# Patient Record
Sex: Female | Born: 1980 | Race: Black or African American | Hispanic: No | Marital: Single | Smoking: Former smoker
Health system: Southern US, Community
[De-identification: ages and names within clinical notes are randomized; demographics above are authoritative.]

## PROBLEM LIST (undated history)

## (undated) DIAGNOSIS — I10 Essential (primary) hypertension: Secondary | ICD-10-CM

## (undated) DIAGNOSIS — S3992XA Unspecified injury of lower back, initial encounter: Secondary | ICD-10-CM

## (undated) DIAGNOSIS — J45909 Unspecified asthma, uncomplicated: Secondary | ICD-10-CM

## (undated) HISTORY — DX: Unspecified asthma, uncomplicated: J45.909

## (undated) HISTORY — DX: Essential (primary) hypertension: I10

## (undated) HISTORY — PX: NO PAST SURGERIES: SHX2092

---

## 2004-06-23 ENCOUNTER — Ambulatory Visit (HOSPITAL_COMMUNITY): Admission: RE | Admit: 2004-06-23 | Discharge: 2004-06-23 | Payer: Self-pay | Admitting: *Deleted

## 2004-09-20 ENCOUNTER — Ambulatory Visit: Payer: Self-pay | Admitting: Family Medicine

## 2004-09-20 ENCOUNTER — Inpatient Hospital Stay (HOSPITAL_COMMUNITY): Admission: AD | Admit: 2004-09-20 | Discharge: 2004-09-20 | Payer: Self-pay | Admitting: *Deleted

## 2004-09-27 ENCOUNTER — Ambulatory Visit: Payer: Self-pay | Admitting: *Deleted

## 2004-10-05 ENCOUNTER — Ambulatory Visit: Payer: Self-pay | Admitting: Family Medicine

## 2004-10-12 ENCOUNTER — Ambulatory Visit: Payer: Self-pay | Admitting: Family Medicine

## 2004-10-19 ENCOUNTER — Ambulatory Visit: Payer: Self-pay | Admitting: Family Medicine

## 2004-10-23 ENCOUNTER — Ambulatory Visit: Payer: Self-pay | Admitting: *Deleted

## 2004-10-26 ENCOUNTER — Ambulatory Visit: Payer: Self-pay | Admitting: Family Medicine

## 2004-10-27 ENCOUNTER — Inpatient Hospital Stay (HOSPITAL_COMMUNITY): Admission: AD | Admit: 2004-10-27 | Discharge: 2004-10-27 | Payer: Self-pay | Admitting: *Deleted

## 2004-10-27 ENCOUNTER — Inpatient Hospital Stay (HOSPITAL_COMMUNITY): Admission: AD | Admit: 2004-10-27 | Discharge: 2004-10-30 | Payer: Self-pay | Admitting: *Deleted

## 2004-10-28 ENCOUNTER — Ambulatory Visit: Payer: Self-pay | Admitting: *Deleted

## 2004-10-31 ENCOUNTER — Inpatient Hospital Stay (HOSPITAL_COMMUNITY): Admission: AD | Admit: 2004-10-31 | Discharge: 2004-10-31 | Payer: Self-pay | Admitting: *Deleted

## 2006-10-31 ENCOUNTER — Emergency Department (HOSPITAL_COMMUNITY): Admission: EM | Admit: 2006-10-31 | Discharge: 2006-10-31 | Payer: Self-pay | Admitting: Emergency Medicine

## 2007-12-31 ENCOUNTER — Emergency Department (HOSPITAL_COMMUNITY): Admission: EM | Admit: 2007-12-31 | Discharge: 2007-12-31 | Payer: Self-pay | Admitting: Emergency Medicine

## 2009-04-25 ENCOUNTER — Ambulatory Visit (HOSPITAL_COMMUNITY): Admission: RE | Admit: 2009-04-25 | Discharge: 2009-04-25 | Payer: Self-pay | Admitting: Obstetrics

## 2009-04-28 ENCOUNTER — Emergency Department (HOSPITAL_COMMUNITY): Admission: EM | Admit: 2009-04-28 | Discharge: 2009-04-28 | Payer: Self-pay | Admitting: Emergency Medicine

## 2009-06-24 ENCOUNTER — Inpatient Hospital Stay (HOSPITAL_COMMUNITY): Admission: AD | Admit: 2009-06-24 | Discharge: 2009-06-24 | Payer: Self-pay | Admitting: Obstetrics

## 2009-06-24 ENCOUNTER — Ambulatory Visit: Payer: Self-pay | Admitting: Advanced Practice Midwife

## 2009-09-05 ENCOUNTER — Inpatient Hospital Stay (HOSPITAL_COMMUNITY): Admission: RE | Admit: 2009-09-05 | Discharge: 2009-09-07 | Payer: Self-pay | Admitting: Obstetrics

## 2009-09-13 ENCOUNTER — Inpatient Hospital Stay (HOSPITAL_COMMUNITY): Admission: AD | Admit: 2009-09-13 | Discharge: 2009-09-13 | Payer: Self-pay | Admitting: Obstetrics

## 2009-12-30 ENCOUNTER — Encounter: Admission: RE | Admit: 2009-12-30 | Discharge: 2009-12-30 | Payer: Self-pay | Admitting: Neurology

## 2010-10-11 LAB — CBC
HCT: 26.5 % — ABNORMAL LOW (ref 36.0–46.0)
HCT: 30.6 % — ABNORMAL LOW (ref 36.0–46.0)
HCT: 32.3 % — ABNORMAL LOW (ref 36.0–46.0)
HCT: 32.7 % — ABNORMAL LOW (ref 36.0–46.0)
Hemoglobin: 10.2 g/dL — ABNORMAL LOW (ref 12.0–15.0)
Hemoglobin: 10.7 g/dL — ABNORMAL LOW (ref 12.0–15.0)
Hemoglobin: 10.9 g/dL — ABNORMAL LOW (ref 12.0–15.0)
Hemoglobin: 8.8 g/dL — ABNORMAL LOW (ref 12.0–15.0)
MCHC: 33 g/dL (ref 30.0–36.0)
MCHC: 33.2 g/dL (ref 30.0–36.0)
MCHC: 33.3 g/dL (ref 30.0–36.0)
MCHC: 33.3 g/dL (ref 30.0–36.0)
MCV: 91.3 fL (ref 78.0–100.0)
MCV: 92.2 fL (ref 78.0–100.0)
MCV: 92.6 fL (ref 78.0–100.0)
MCV: 92.8 fL (ref 78.0–100.0)
Platelets: 202 10*3/uL (ref 150–400)
Platelets: 248 10*3/uL (ref 150–400)
Platelets: 256 10*3/uL (ref 150–400)
Platelets: 439 10*3/uL — ABNORMAL HIGH (ref 150–400)
RBC: 2.86 MIL/uL — ABNORMAL LOW (ref 3.87–5.11)
RBC: 3.36 MIL/uL — ABNORMAL LOW (ref 3.87–5.11)
RBC: 3.49 MIL/uL — ABNORMAL LOW (ref 3.87–5.11)
RBC: 3.54 MIL/uL — ABNORMAL LOW (ref 3.87–5.11)
RDW: 14.4 % (ref 11.5–15.5)
RDW: 14.6 % (ref 11.5–15.5)
RDW: 15.2 % (ref 11.5–15.5)
RDW: 15.3 % (ref 11.5–15.5)
WBC: 10.7 10*3/uL — ABNORMAL HIGH (ref 4.0–10.5)
WBC: 11.3 10*3/uL — ABNORMAL HIGH (ref 4.0–10.5)
WBC: 5.9 10*3/uL (ref 4.0–10.5)
WBC: 5.9 10*3/uL (ref 4.0–10.5)

## 2010-10-11 LAB — URINE MICROSCOPIC-ADD ON

## 2010-10-11 LAB — COMPREHENSIVE METABOLIC PANEL
ALT: 30 U/L (ref 0–35)
AST: 35 U/L (ref 0–37)
Albumin: 2.5 g/dL — ABNORMAL LOW (ref 3.5–5.2)
Alkaline Phosphatase: 130 U/L — ABNORMAL HIGH (ref 39–117)
BUN: 6 mg/dL (ref 6–23)
CO2: 24 mEq/L (ref 19–32)
Calcium: 8.6 mg/dL (ref 8.4–10.5)
Chloride: 107 mEq/L (ref 96–112)
Creatinine, Ser: 0.55 mg/dL (ref 0.4–1.2)
GFR calc Af Amer: >60 mL/min (ref >60)
GFR calc non Af Amer: >60 mL/min (ref >60)
Glucose, Bld: 111 mg/dL — ABNORMAL HIGH (ref 70–99)
Potassium: 3.2 mEq/L — ABNORMAL LOW (ref 3.5–5.1)
Sodium: 136 mEq/L (ref 135–145)
Total Bilirubin: 0.4 mg/dL (ref 0.3–1.2)
Total Protein: 5.4 g/dL — ABNORMAL LOW (ref 6.0–8.3)

## 2010-10-11 LAB — CCBB MATERNAL DONOR DRAW

## 2010-10-11 LAB — ALT: ALT: 21 U/L (ref 0–35)

## 2010-10-11 LAB — URINALYSIS, ROUTINE W REFLEX MICROSCOPIC
Bilirubin Urine: NEGATIVE
Glucose, UA: NEGATIVE mg/dL
Ketones, ur: NEGATIVE mg/dL
Nitrite: NEGATIVE
Protein, ur: 30 mg/dL — AB
Specific Gravity, Urine: 1.025 (ref 1.005–1.030)
Urobilinogen, UA: 0.2 mg/dL (ref 0.0–1.0)
pH: 6.5 (ref 5.0–8.0)

## 2010-10-11 LAB — LACTATE DEHYDROGENASE
LDH: 158 U/L (ref 94–250)
LDH: 208 U/L (ref 94–250)

## 2010-10-11 LAB — RPR: RPR Ser Ql: NONREACTIVE

## 2010-10-11 LAB — URIC ACID
Uric Acid, Serum: 3.9 mg/dL (ref 2.4–7.0)
Uric Acid, Serum: 5.5 mg/dL (ref 2.4–7.0)

## 2010-10-11 LAB — AST: AST: 20 U/L (ref 0–37)

## 2010-10-24 LAB — URINALYSIS, ROUTINE W REFLEX MICROSCOPIC
Bilirubin Urine: NEGATIVE
Glucose, UA: NEGATIVE mg/dL
Hgb urine dipstick: NEGATIVE
Ketones, ur: >80 mg/dL — AB
Nitrite: NEGATIVE
Protein, ur: NEGATIVE mg/dL
Specific Gravity, Urine: >1.03 — ABNORMAL HIGH (ref 1.005–1.030)
Urobilinogen, UA: 0.2 mg/dL (ref 0.0–1.0)
pH: 6 (ref 5.0–8.0)

## 2011-04-19 LAB — RAPID STREP SCREEN (MED CTR MEBANE ONLY): Streptococcus, Group A Screen (Direct): POSITIVE — AB

## 2013-11-04 ENCOUNTER — Encounter (HOSPITAL_COMMUNITY): Payer: Self-pay | Admitting: Emergency Medicine

## 2013-11-04 ENCOUNTER — Emergency Department (HOSPITAL_COMMUNITY)
Admission: EM | Admit: 2013-11-04 | Discharge: 2013-11-04 | Disposition: A | Discharge status: Home / Self Care | Payer: Self-pay | Attending: Emergency Medicine | Admitting: Emergency Medicine

## 2013-11-04 DIAGNOSIS — F17200 Nicotine dependence, unspecified, uncomplicated: Secondary | ICD-10-CM | POA: Insufficient documentation

## 2013-11-04 DIAGNOSIS — M62838 Other muscle spasm: Secondary | ICD-10-CM | POA: Insufficient documentation

## 2013-11-04 DIAGNOSIS — Z87828 Personal history of other (healed) physical injury and trauma: Secondary | ICD-10-CM | POA: Insufficient documentation

## 2013-11-04 DIAGNOSIS — M25559 Pain in unspecified hip: Secondary | ICD-10-CM | POA: Insufficient documentation

## 2013-11-04 DIAGNOSIS — M549 Dorsalgia, unspecified: Secondary | ICD-10-CM | POA: Insufficient documentation

## 2013-11-04 DIAGNOSIS — M62830 Muscle spasm of back: Secondary | ICD-10-CM

## 2013-11-04 HISTORY — DX: Unspecified injury of lower back, initial encounter: S39.92XA

## 2013-11-04 MED ORDER — CYCLOBENZAPRINE HCL 10 MG PO TABS: 10.0000 mg | ORAL_TABLET | Freq: Three times a day (TID) | ORAL | Status: DC | PRN

## 2013-11-04 MED ORDER — NAPROXEN 500 MG PO TABS: 500.0000 mg | ORAL_TABLET | Freq: Two times a day (BID) | ORAL | Status: DC

## 2013-11-04 MED ORDER — HYDROCODONE-ACETAMINOPHEN 5-325 MG PO TABS: 1.0000 | ORAL_TABLET | Freq: Four times a day (QID) | ORAL | Status: DC | PRN

## 2013-11-04 NOTE — Discharge Instructions (Signed)
SEEK IMMEDIATE MEDICAL ATTENTION IF: °New numbness, tingling, weakness, or problem with the use of your arms or legs.  °Severe back pain not relieved with medications.  °Change in bowel or bladder control.  °Increasing pain in any areas of the body (such as chest or abdominal pain).  °Shortness of breath, dizziness or fainting.  °Nausea (feeling sick to your stomach), vomiting, fever, or sweats. ° ° ° °Back Pain, Adult °Low back pain is very common. About 1 in 5 people have back pain. The cause of low back pain is rarely dangerous. The pain often gets better over time. About half of people with a sudden onset of back pain feel better in just 2 weeks. About 8 in 10 people feel better by 6 weeks.  °CAUSES °Some common causes of back pain include: °· Strain of the muscles or ligaments supporting the spine. °· Wear and tear (degeneration) of the spinal discs. °· Arthritis. °· Direct injury to the back. °DIAGNOSIS °Most of the time, the direct cause of low back pain is not known. However, back pain can be treated effectively even when the exact cause of the pain is unknown. Answering your caregiver's questions about your overall health and symptoms is one of the most accurate ways to make sure the cause of your pain is not dangerous. If your caregiver needs more information, he or she may order lab work or imaging tests (X-rays or MRIs). However, even if imaging tests show changes in your back, this usually does not require surgery. °HOME CARE INSTRUCTIONS °For many people, back pain returns. Since low back pain is rarely dangerous, it is often a condition that people can learn to manage on their own.  °· Remain active. It is stressful on the back to sit or stand in one place. Do not sit, drive, or stand in one place for more than 30 minutes at a time. Take short walks on level surfaces as soon as pain allows. Try to increase the length of time you walk each day. °· Do not stay in bed. Resting more than 1 or 2 days can  delay your recovery. °· Do not avoid exercise or work. Your body is made to move. It is not dangerous to be active, even though your back may hurt. Your back will likely heal faster if you return to being active before your pain is gone. °· Pay attention to your body when you  bend and lift. Many people have less discomfort when lifting if they bend their knees, keep the load close to their bodies, and avoid twisting. Often, the most comfortable positions are those that put less stress on your recovering back. °· Find a comfortable position to sleep. Use a firm mattress and lie on your side with your knees slightly bent. If you lie on your back, put a pillow under your knees. °· Only take over-the-counter or prescription medicines as directed by your caregiver. Over-the-counter medicines to reduce pain and inflammation are often the most helpful. Your caregiver may prescribe muscle relaxant drugs. These medicines help dull your pain so you can more quickly return to your normal activities and healthy exercise. °· Put ice on the injured area. °· Put ice in a plastic bag. °· Place a towel between your skin and the bag. °· Leave the ice on for 15-20 minutes, 03-04 times a day for the first 2 to 3 days. After that, ice and heat may be alternated to reduce pain and spasms. °· Ask your caregiver about trying back exercises and gentle massage. This may be of some benefit. °· Avoid feeling anxious   or stressed.Stress increases muscle tension and can worsen back pain.It is important to recognize when you are anxious or stressed and learn ways to manage it.Exercise is a great option. SEEK MEDICAL CARE IF:  You have pain that is not relieved with rest or medicine.  You have pain that does not improve in 1 week.  You have new symptoms.  You are generally not feeling well. SEEK IMMEDIATE MEDICAL CARE IF:   You have pain that radiates from your back into your legs.  You develop new bowel or bladder control  problems.  You have unusual weakness or numbness in your arms or legs.  You develop nausea or vomiting.  You develop abdominal pain.  You feel faint. Document Released: 07/09/2005 Document Revised: 01/08/2012 Document Reviewed: 11/27/2010 [facility] Patient Information 2014 [place], [place].  Musculoskeletal Pain Musculoskeletal pain is muscle and boney aches and pains. These pains can occur in any part of the body. Your caregiver may treat you without knowing the cause of the pain. They may treat you if blood or urine tests, X-rays, and other tests were normal.  CAUSES There is often not a definite cause or reason for these pains. These pains may be caused by a type of germ (virus). The discomfort may also come from overuse. Overuse includes working out too hard when your body is not fit. Boney aches also come from weather changes. Bone is sensitive to atmospheric pressure changes. HOME CARE INSTRUCTIONS   Ask when your test results will be ready. Make sure you get your test results.  Only take over-the-counter or prescription medicines for pain, discomfort, or fever as directed by your caregiver. If you were given medications for your condition, do not drive, operate machinery or power tools, or sign legal documents for 24 hours. Do not drink alcohol. Do not take sleeping pills or other medications that may interfere with treatment.  Continue all activities unless the activities cause more pain. When the pain lessens, slowly resume normal activities. Gradually increase the intensity and duration of the activities or exercise.  During periods of severe pain, bed rest may be helpful. Lay or sit in any position that is comfortable.  Putting ice on the injured area.  Put ice in a bag.  Place a towel between your skin and the bag.  Leave the ice on for 15 to 20 minutes, 3 to 4 times a day.  Follow up with your caregiver for continued problems and no reason can be found for the pain. If  the pain becomes worse or does not go away, it may be necessary to repeat tests or do additional testing. Your caregiver may need to look further for a possible cause. SEEK IMMEDIATE MEDICAL CARE IF:  You have pain that is getting worse and is not relieved by medications.  You develop chest pain that is associated with shortness or breath, sweating, feeling sick to your stomach (nauseous), or throw up (vomit).  Your pain becomes localized to the abdomen.  You develop any new symptoms that seem different or that concern you. MAKE SURE YOU:   Understand these instructions.  Will watch your condition.  Will get help right away if you are not doing well or get worse. Document Released: 07/09/2005 Document Revised: 10/01/2011 Document Reviewed: 03/13/2013 [address] [facility]Exit[facility] Patient Information 2014 [place], [place].

## 2013-11-04 NOTE — ED Notes (Signed)
Pt states she woke up Saturday with a little stiffness in her back and it has progressively gotten worse since then. C/o to mid lower and also on the right side as well. Thought was pulled muscle but not so sure.
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
## 2013-11-06 NOTE — ED Provider Notes (Signed)
Medical screening examination/treatment/procedure(s) were performed by non-physician practitioner and as supervising physician I was immediately available for consultation/collaboration.   EKG Interpretation None        Gwyneth SproutWhitney Jaleya Pebley, MD 11/06/13 1607

## 2014-06-29 ENCOUNTER — Encounter: Payer: Self-pay | Admitting: Family Medicine

## 2014-06-29 ENCOUNTER — Ambulatory Visit (INDEPENDENT_AMBULATORY_CARE_PROVIDER_SITE_OTHER): Payer: Self-pay | Admitting: Family Medicine

## 2014-06-29 VITALS — BP 125/88 | HR 93 | Ht 63.0 in | Wt 108.0 lb

## 2014-06-29 DIAGNOSIS — Z23 Encounter for immunization: Secondary | ICD-10-CM

## 2014-06-29 DIAGNOSIS — Z3490 Encounter for supervision of normal pregnancy, unspecified, unspecified trimester: Secondary | ICD-10-CM | POA: Insufficient documentation

## 2014-06-29 DIAGNOSIS — Z1151 Encounter for screening for human papillomavirus (HPV): Secondary | ICD-10-CM

## 2014-06-29 DIAGNOSIS — Z3491 Encounter for supervision of normal pregnancy, unspecified, first trimester: Secondary | ICD-10-CM

## 2014-06-29 DIAGNOSIS — Z113 Encounter for screening for infections with a predominantly sexual mode of transmission: Secondary | ICD-10-CM

## 2014-06-29 DIAGNOSIS — Z124 Encounter for screening for malignant neoplasm of cervix: Secondary | ICD-10-CM

## 2014-06-29 NOTE — Progress Notes (Signed)
   Subjective:    Cheryl Daniels is a [name] 1931w5d being seen today for her first obstetrical visit.  Her obstetrical history is not significant. Pregnancy history fully reviewed.  Patient reports no complaints.  Filed Vitals:   06/29/14 1109 06/29/14 1110  BP: 125/88   Pulse: 93   Height:  5\' 3"  (1.6 m)  Weight: 108 lb (48.988 kg)     HISTORY: OB History  Gravida Para Term Preterm AB SAB TAB Ectopic Multiple Living  3 2 2       4     # Outcome Date GA Lbr Len/2nd Weight Sex Delivery Anes PTL Lv  3 Current           2 Term 09/05/09 1929w0d  5 lb 6 oz (2.438 kg) F Vag-Spont  N Y  1 Term 10/28/04 5179w0d  7 lb 11 oz (3.487 kg) M Vag-Spont  N Y     Past Medical History  Diagnosis Date  . Back injuries   . Asthma    Past Surgical History  Procedure Laterality Date  . No past surgeries     Family History  Problem Relation Age of Onset  . Hypertension Father      Exam    Uterus:   10 wks size  Pelvic Exam:    Perineum: Normal Perineum   Vulva: Bartholin's, Urethra, Skene's normal   Vagina:  normal mucosa, normal discharge   Cervix: multiparous appearance   Adnexa: no mass, fullness, tenderness   Bony Pelvis: average   Skin: normal coloration and turgor, no rashes    Neurologic: normal   Extremities: normal strength, tone, and muscle mass   HEENT sclera clear, anicteric   Mouth/Teeth mucous membranes moist, pharynx normal without lesions and dental hygiene good   Neck supple   Cardiovascular: regular rate and rhythm, no murmurs or gallops   Respiratory:  appears well, vitals normal, no respiratory distress, acyanotic, normal RR, ear and throat exam is normal, neck free of mass or lymphadenopathy, chest clear, no wheezing, crepitations, rhonchi, normal symmetric air entry   Abdomen: soft, non-tender; bowel sounds normal; no masses,  no organomegaly     TVUS reveals SIUP with flicker, size = dates Assessment:    Pregnancy: [name] Patient Active Problem List   Diagnosis Date Noted  . Supervision of normal pregnancy 06/29/2014        Plan:     Initial labs drawn. Prenatal vitamins. Problem list reviewed and updated. Genetic Screening discussed First Screen: offered. Ultrasound discussed; fetal survey: results reviewed. Follow up in 4 weeks.    Cheryl Daniels S 06/29/2014

## 2014-06-29 NOTE — Patient Instructions (Signed)
First Trimester of Pregnancy The first trimester of pregnancy is from week 1 until the end of week 12 (months 1 through 3). A week after a sperm fertilizes an egg, the egg will implant on the wall of the uterus. This embryo will begin to develop into a baby. Genes from you and your partner are forming the baby. The female genes determine whether the baby is a boy or a girl. At 6-8 weeks, the eyes and face are formed, and the heartbeat can be seen on ultrasound. At the end of 12 weeks, all the baby's organs are formed.  Now that you are pregnant, you will want to do everything you can to have a healthy baby. Two of the most important things are to get good prenatal care and to follow your health care provider's instructions. Prenatal care is all the medical care you receive before the baby's birth. This care will help prevent, find, and treat any problems during the pregnancy and childbirth. BODY CHANGES Your body goes through many changes during pregnancy. The changes vary from woman to woman.   You may gain or lose a couple of pounds at first.  You may feel sick to your stomach (nauseous) and throw up (vomit). If the vomiting is uncontrollable, call your health care provider.  You may tire easily.  You may develop headaches that can be relieved by medicines approved by your health care provider.  You may urinate more often. Painful urination may mean you have a bladder infection.  You may develop heartburn as a result of your pregnancy.  You may develop constipation because certain hormones are causing the muscles that push waste through your intestines to slow down.  You may develop hemorrhoids or swollen, bulging veins (varicose veins).  Your breasts may begin to grow larger and become tender. Your nipples may stick out more, and the tissue that surrounds them (areola) may become darker.  Your gums may bleed and may be sensitive to brushing and flossing.  Dark spots or blotches (chloasma,  mask of pregnancy) may develop on your face. This will likely fade after the baby is born.  Your menstrual periods will stop.  You may have a loss of appetite.  You may develop cravings for certain kinds of food.  You may have changes in your emotions from day to day, such as being excited to be pregnant or being concerned that something may go wrong with the pregnancy and baby.  You may have more vivid and strange dreams.  You may have changes in your hair. These can include thickening of your hair, rapid growth, and changes in texture. Some women also have hair loss during or after pregnancy, or hair that feels dry or thin. Your hair will most likely return to normal after your baby is born. WHAT TO EXPECT AT YOUR PRENATAL VISITS During a routine prenatal visit:  You will be weighed to make sure you and the baby are growing normally.  Your blood pressure will be taken.  Your abdomen will be measured to track your baby's growth.  The fetal heartbeat will be listened to starting around week 10 or 12 of your pregnancy.  Test results from any previous visits will be discussed. Your health care provider may ask you:  How you are feeling.  If you are feeling the baby move.  If you have had any abnormal symptoms, such as leaking fluid, bleeding, severe headaches, or abdominal cramping.  If you have any questions. Other tests   that may be performed during your first trimester include:  Blood tests to find your blood type and to check for the presence of any previous infections. They will also be used to check for low iron levels (anemia) and Rh antibodies. Later in the pregnancy, blood tests for diabetes will be done along with other tests if problems develop.  Urine tests to check for infections, diabetes, or protein in the urine.  An ultrasound to confirm the proper growth and development of the baby.  An amniocentesis to check for possible genetic problems.  Fetal screens for  spina bifida and Down syndrome.  You may need other tests to make sure you and the baby are doing well. HOME CARE INSTRUCTIONS  Medicines  Follow your health care provider's instructions regarding medicine use. Specific medicines may be either safe or unsafe to take during pregnancy.  Take your prenatal vitamins as directed.  If you develop constipation, try taking a stool softener if your health care provider approves. Diet  Eat regular, well-balanced meals. Choose a variety of foods, such as meat or vegetable-based protein, fish, milk and low-fat dairy products, vegetables, fruits, and whole grain breads and cereals. Your health care provider will help you determine the amount of weight gain that is right for you.  Avoid raw meat and uncooked cheese. These carry germs that can cause birth defects in the baby.  Eating four or five small meals rather than three large meals a day may help relieve nausea and vomiting. If you start to feel nauseous, eating a few soda crackers can be helpful. Drinking liquids between meals instead of during meals also seems to help nausea and vomiting.  If you develop constipation, eat more high-fiber foods, such as fresh vegetables or fruit and whole grains. Drink enough fluids to keep your urine clear or pale yellow. Activity and Exercise  Exercise only as directed by your health care provider. Exercising will help you:  Control your weight.  Stay in shape.  Be prepared for labor and delivery.  Experiencing pain or cramping in the lower abdomen or low back is a good sign that you should stop exercising. Check with your health care provider before continuing normal exercises.  Try to avoid standing for long periods of time. Move your legs often if you must stand in one place for a long time.  Avoid heavy lifting.  Wear low-heeled shoes, and practice good posture.  You may continue to have sex unless your health care provider directs you  otherwise. Relief of Pain or Discomfort  Wear a good support bra for breast tenderness.   Take warm sitz baths to soothe any pain or discomfort caused by hemorrhoids. Use hemorrhoid cream if your health care provider approves.   Rest with your legs elevated if you have leg cramps or low back pain.  If you develop varicose veins in your legs, wear support hose. Elevate your feet for 15 minutes, 3-4 times a day. Limit salt in your diet. Prenatal Care  Schedule your prenatal visits by the twelfth week of pregnancy. They are usually scheduled monthly at first, then more often in the last 2 months before delivery.  Write down your questions. Take them to your prenatal visits.  Keep all your prenatal visits as directed by your health care provider. Safety  Wear your seat belt at all times when driving.  Make a list of emergency phone numbers, including numbers for family, friends, the hospital, and police and fire departments. General Tips    Ask your health care provider for a referral to a local prenatal education class. Begin classes no later than at the beginning of month 6 of your pregnancy.  Ask for help if you have counseling or nutritional needs during pregnancy. Your health care provider can offer advice or refer you to specialists for help with various needs.  Do not use hot tubs, steam rooms, or saunas.  Do not douche or use tampons or scented sanitary pads.  Do not cross your legs for long periods of time.  Avoid cat litter boxes and soil used by cats. These carry germs that can cause birth defects in the baby and possibly loss of the fetus by miscarriage or stillbirth.  Avoid all smoking, herbs, alcohol, and medicines not prescribed by your health care provider. Chemicals in these affect the formation and growth of the baby.  Schedule a dentist appointment. At home, brush your teeth with a soft toothbrush and be gentle when you floss. SEEK MEDICAL CARE IF:   You have  dizziness.  You have mild pelvic cramps, pelvic pressure, or nagging pain in the abdominal area.  You have persistent nausea, vomiting, or diarrhea.  You have a bad smelling vaginal discharge.  You have pain with urination.  You notice increased swelling in your face, hands, legs, or ankles. SEEK IMMEDIATE MEDICAL CARE IF:   You have a fever.  You are leaking fluid from your vagina.  You have spotting or bleeding from your vagina.  You have severe abdominal cramping or pain.  You have rapid weight gain or loss.  You vomit blood or material that looks like coffee grounds.  You are exposed to German measles and have never had them.  You are exposed to fifth disease or chickenpox.  You develop a severe headache.  You have shortness of breath.  You have any kind of trauma, such as from a fall or a car accident. Document Released: 07/03/2001 Document Revised: 11/23/2013 Document Reviewed: 05/19/2013 [employer] Patient Information 2015 [employer]. This information is not intended to replace advice given to you by your health care provider. Make sure you discuss any questions you have with your health care provider.  

## 2014-06-29 NOTE — Progress Notes (Signed)
Bedside ultrasound shows CRL of 9 wks 3 days.  Positive fetal heart rate seen and heard on scan.  It has been two years since last pap was done.

## 2014-06-30 ENCOUNTER — Other Ambulatory Visit: Payer: Self-pay | Admitting: *Deleted

## 2014-06-30 ENCOUNTER — Other Ambulatory Visit: Payer: Self-pay | Admitting: Family Medicine

## 2014-06-30 DIAGNOSIS — Z369 Encounter for antenatal screening, unspecified: Secondary | ICD-10-CM

## 2014-06-30 DIAGNOSIS — Z3490 Encounter for supervision of normal pregnancy, unspecified, unspecified trimester: Secondary | ICD-10-CM

## 2014-06-30 LAB — PRENATAL PROFILE (SOLSTAS)
Antibody Screen: NEGATIVE
Basophils Absolute: 0 10*3/uL (ref 0.0–0.1)
Basophils Relative: 0 % (ref 0–1)
Eosinophils Absolute: 0.6 10*3/uL (ref 0.0–0.7)
Eosinophils Relative: 8 % — ABNORMAL HIGH (ref 0–5)
HCT: 40 % (ref 36.0–46.0)
HIV 1&2 Ab, 4th Generation: NONREACTIVE
Hemoglobin: 13 g/dL (ref 12.0–15.0)
Hepatitis B Surface Ag: NEGATIVE
Lymphocytes Relative: 34 % (ref 12–46)
Lymphs Abs: 2.5 10*3/uL (ref 0.7–4.0)
MCH: 29.4 pg (ref 26.0–34.0)
MCHC: 32.5 g/dL (ref 30.0–36.0)
MCV: 90.5 fL (ref 78.0–100.0)
MPV: 9.6 fL (ref 9.4–12.4)
Monocytes Absolute: 0.5 10*3/uL (ref 0.1–1.0)
Monocytes Relative: 7 % (ref 3–12)
Neutro Abs: 3.7 10*3/uL (ref 1.7–7.7)
Neutrophils Relative %: 51 % (ref 43–77)
Platelets: 371 10*3/uL (ref 150–400)
RBC: 4.42 MIL/uL (ref 3.87–5.11)
RDW: 15.1 % (ref 11.5–15.5)
Rh Type: POSITIVE
Rubella: 1.24 Index — ABNORMAL HIGH (ref <0.90)
WBC: 7.3 10*3/uL (ref 4.0–10.5)

## 2014-06-30 LAB — OB RESULTS CONSOLE GC/CHLAMYDIA
Chlamydia: NEGATIVE
GC PROBE AMP, GENITAL: NEGATIVE

## 2014-06-30 MED ORDER — PRENATAL VITAMIN 27-0.8 MG PO TABS: 1.0000 | ORAL_TABLET | Freq: Every morning | ORAL | Status: DC

## 2014-06-30 NOTE — Telephone Encounter (Signed)
Pt needs a prescription for prenatal vitamins sent in.

## 2014-07-01 LAB — CULTURE, OB URINE
Colony Count: NO GROWTH
Organism ID, Bacteria: NO GROWTH

## 2014-07-01 LAB — CYTOLOGY - PAP

## 2014-07-22 ENCOUNTER — Ambulatory Visit (HOSPITAL_COMMUNITY)
Admission: RE | Admit: 2014-07-22 | Discharge: 2014-07-22 | Disposition: A | Discharge status: Home / Self Care | Payer: Medicaid Other | Source: Ambulatory Visit | Attending: Family Medicine | Admitting: Family Medicine

## 2014-07-22 ENCOUNTER — Encounter (HOSPITAL_COMMUNITY): Payer: Self-pay

## 2014-07-22 DIAGNOSIS — Z3A13 13 weeks gestation of pregnancy: Secondary | ICD-10-CM | POA: Insufficient documentation

## 2014-07-22 DIAGNOSIS — Z36 Encounter for antenatal screening of mother: Secondary | ICD-10-CM | POA: Diagnosis present

## 2014-07-22 DIAGNOSIS — Z369 Encounter for antenatal screening, unspecified: Secondary | ICD-10-CM | POA: Insufficient documentation

## 2014-07-23 NOTE — L&D Delivery Note (Signed)
Delivery Note At 2:45 PM a viable female was delivered via Vaginal, Spontaneous Delivery (Presentation: Left Occiput Anterior).  APGAR: , ; weight  .   Placenta status: Intact, Spontaneous.  Cord:  with the following complications: .  Cord pH: not done  Anesthesia: Epidural  Episiotomy:   Lacerations: Sulcus Suture Repair: 2.0 vicryl Est. Blood Loss (mL):    Mom to postpartum.  Baby to Couplet care / Skin to Skin.  Shady Padron A 01/19/2015, 2:53 PM

## 2014-07-27 ENCOUNTER — Ambulatory Visit (INDEPENDENT_AMBULATORY_CARE_PROVIDER_SITE_OTHER): Payer: Medicaid Other | Admitting: Family Medicine

## 2014-07-27 ENCOUNTER — Encounter: Payer: Self-pay | Admitting: Family Medicine

## 2014-07-27 VITALS — BP 113/84 | HR 93 | Wt 116.0 lb

## 2014-07-27 DIAGNOSIS — Z3491 Encounter for supervision of normal pregnancy, unspecified, first trimester: Secondary | ICD-10-CM

## 2014-07-27 NOTE — Progress Notes (Signed)
First screen done--nml NT--awaiting blood results Reports heavier vaginal discharge Otherwise feeling well.

## 2014-07-27 NOTE — Patient Instructions (Signed)
Second Trimester of Pregnancy The second trimester is from week 13 through week 28, months 4 through 6. The second trimester is often a time when you feel your best. Your body has also adjusted to being pregnant, and you begin to feel better physically. Usually, morning sickness has lessened or quit completely, you may have more energy, and you may have an increase in appetite. The second trimester is also a time when the fetus is growing rapidly. At the end of the sixth month, the fetus is about 9 inches long and weighs about 1 pounds. You will likely begin to feel the baby move (quickening) between 18 and 20 weeks of the pregnancy. BODY CHANGES Your body goes through many changes during pregnancy. The changes vary from woman to woman.   Your weight will continue to increase. You will notice your lower abdomen bulging out.  You may begin to get stretch marks on your hips, abdomen, and breasts.  You may develop headaches that can be relieved by medicines approved by your health care provider.  You may urinate more often because the fetus is pressing on your bladder.  You may develop or continue to have heartburn as a result of your pregnancy.  You may develop constipation because certain hormones are causing the muscles that push waste through your intestines to slow down.  You may develop hemorrhoids or swollen, bulging veins (varicose veins).  You may have back pain because of the weight gain and pregnancy hormones relaxing your joints between the bones in your pelvis and as a result of a shift in weight and the muscles that support your balance.  Your breasts will continue to grow and be tender.  Your gums may bleed and may be sensitive to brushing and flossing.  Dark spots or blotches (chloasma, mask of pregnancy) may develop on your face. This will likely fade after the baby is born.  A dark line from your belly button to the pubic area (linea nigra) may appear. This will likely  fade after the baby is born.  You may have changes in your hair. These can include thickening of your hair, rapid growth, and changes in texture. Some women also have hair loss during or after pregnancy, or hair that feels dry or thin. Your hair will most likely return to normal after your baby is born. WHAT TO EXPECT AT YOUR PRENATAL VISITS During a routine prenatal visit:  You will be weighed to make sure you and the fetus are growing normally.  Your blood pressure will be taken.  Your abdomen will be measured to track your baby's growth.  The fetal heartbeat will be listened to.  Any test results from the previous visit will be discussed. Your health care provider may ask you:  How you are feeling.  If you are feeling the baby move.  If you have had any abnormal symptoms, such as leaking fluid, bleeding, severe headaches, or abdominal cramping.  If you have any questions. Other tests that may be performed during your second trimester include:  Blood tests that check for:  Low iron levels (anemia).  Gestational diabetes (between 24 and 28 weeks).  Rh antibodies.  Urine tests to check for infections, diabetes, or protein in the urine.  An ultrasound to confirm the proper growth and development of the baby.  An amniocentesis to check for possible genetic problems.  Fetal screens for spina bifida and Down syndrome. HOME CARE INSTRUCTIONS   Avoid all smoking, herbs, alcohol, and unprescribed   drugs. These chemicals affect the formation and growth of the baby.  Follow your health care provider's instructions regarding medicine use. There are medicines that are either safe or unsafe to take during pregnancy.  Exercise only as directed by your health care provider. Experiencing uterine cramps is a good sign to stop exercising.  Continue to eat regular, healthy meals.  Wear a good support bra for breast tenderness.  Do not use hot tubs, steam rooms, or saunas.  Wear  your seat belt at all times when driving.  Avoid raw meat, uncooked cheese, cat litter boxes, and soil used by cats. These carry germs that can cause birth defects in the baby.  Take your prenatal vitamins.  Try taking a stool softener (if your health care provider approves) if you develop constipation. Eat more high-fiber foods, such as fresh vegetables or fruit and whole grains. Drink plenty of fluids to keep your urine clear or pale yellow.  Take warm sitz baths to soothe any pain or discomfort caused by hemorrhoids. Use hemorrhoid cream if your health care provider approves.  If you develop varicose veins, wear support hose. Elevate your feet for 15 minutes, 3-4 times a day. Limit salt in your diet.  Avoid heavy lifting, wear low heel shoes, and practice good posture.  Rest with your legs elevated if you have leg cramps or low back pain.  Visit your dentist if you have not gone yet during your pregnancy. Use a soft toothbrush to brush your teeth and be gentle when you floss.  A sexual relationship may be continued unless your health care provider directs you otherwise.  Continue to go to all your prenatal visits as directed by your health care provider. SEEK MEDICAL CARE IF:   You have dizziness.  You have mild pelvic cramps, pelvic pressure, or nagging pain in the abdominal area.  You have persistent nausea, vomiting, or diarrhea.  You have a bad smelling vaginal discharge.  You have pain with urination. SEEK IMMEDIATE MEDICAL CARE IF:   You have a fever.  You are leaking fluid from your vagina.  You have spotting or bleeding from your vagina.  You have severe abdominal cramping or pain.  You have rapid weight gain or loss.  You have shortness of breath with chest pain.  You notice sudden or extreme swelling of your face, hands, ankles, feet, or legs.  You have not felt your baby move in over an hour.  You have severe headaches that do not go away with  medicine.  You have vision changes. Document Released: 07/03/2001 Document Revised: 07/14/2013 Document Reviewed: 09/09/2012 [employer] Patient Information 2015 [employer]. This information is not intended to replace advice given to you by your health care provider. Make sure you discuss any questions you have with your health care provider.  Breastfeeding Deciding to breastfeed is one of the best choices you can make for you and your baby. A change in hormones during pregnancy causes your breast tissue to grow and increases the number and size of your milk ducts. These hormones also allow proteins, sugars, and fats from your blood supply to make breast milk in your milk-producing glands. Hormones prevent breast milk from being released before your baby is born as well as prompt milk flow after birth. Once breastfeeding has begun, thoughts of your baby, as well as his or her sucking or crying, can stimulate the release of milk from your milk-producing glands.  BENEFITS OF BREASTFEEDING For Your Baby  Your first   milk (colostrum) helps your baby's digestive system function better.   There are antibodies in your milk that help your baby fight off infections.   Your baby has a lower incidence of asthma, allergies, and sudden infant death syndrome.   The nutrients in breast milk are better for your baby than infant formulas and are designed uniquely for your baby's needs.   Breast milk improves your baby's brain development.   Your baby is less likely to develop other conditions, such as childhood obesity, asthma, or type 2 diabetes mellitus.  For You   Breastfeeding helps to create a very special bond between you and your baby.   Breastfeeding is convenient. Breast milk is always available at the correct temperature and costs nothing.   Breastfeeding helps to burn calories and helps you lose the weight gained during pregnancy.   Breastfeeding makes your uterus contract to its  prepregnancy size faster and slows bleeding (lochia) after you give birth.   Breastfeeding helps to lower your risk of developing type 2 diabetes mellitus, osteoporosis, and breast or ovarian cancer later in life. SIGNS THAT YOUR BABY IS HUNGRY Early Signs of Hunger  Increased alertness or activity.  Stretching.  Movement of the head from side to side.  Movement of the head and opening of the mouth when the corner of the mouth or cheek is stroked (rooting).  Increased sucking sounds, smacking lips, cooing, sighing, or squeaking.  Hand-to-mouth movements.  Increased sucking of fingers or hands. Late Signs of Hunger  Fussing.  Intermittent crying. Extreme Signs of Hunger Signs of extreme hunger will require calming and consoling before your baby will be able to breastfeed successfully. Do not wait for the following signs of extreme hunger to occur before you initiate breastfeeding:   Restlessness.  A loud, strong cry.   Screaming. BREASTFEEDING BASICS Breastfeeding Initiation  Find a comfortable place to sit or lie down, with your neck and back well supported.  Place a pillow or rolled up blanket under your baby to bring him or her to the level of your breast (if you are seated). Nursing pillows are specially designed to help support your arms and your baby while you breastfeed.  Make sure that your baby's abdomen is facing your abdomen.   Gently massage your breast. With your fingertips, massage from your chest wall toward your nipple in a circular motion. This encourages milk flow. You may need to continue this action during the feeding if your milk flows slowly.  Support your breast with 4 fingers underneath and your thumb above your nipple. Make sure your fingers are well away from your nipple and your baby's mouth.   Stroke your baby's lips gently with your finger or nipple.   When your baby's mouth is open wide enough, quickly bring your baby to your breast,  placing your entire nipple and as much of the colored area around your nipple (areola) as possible into your baby's mouth.   More areola should be visible above your baby's upper lip than below the lower lip.   Your baby's tongue should be between his or her lower gum and your breast.   Ensure that your baby's mouth is correctly positioned around your nipple (latched). Your baby's lips should create a seal on your breast and be turned out (everted).  It is common for your baby to suck about 2-3 minutes in order to start the flow of breast milk. Latching Teaching your baby how to latch on to your breast   properly is very important. An improper latch can cause nipple pain and decreased milk supply for you and poor weight gain in your baby. Also, if your baby is not latched onto your nipple properly, he or she may swallow some air during feeding. This can make your baby fussy. Burping your baby when you switch breasts during the feeding can help to get rid of the air. However, teaching your baby to latch on properly is still the best way to prevent fussiness from swallowing air while breastfeeding. Signs that your baby has successfully latched on to your nipple:    Silent tugging or silent sucking, without causing you pain.   Swallowing heard between every 3-4 sucks.    Muscle movement above and in front of his or her ears while sucking.  Signs that your baby has not successfully latched on to nipple:   Sucking sounds or smacking sounds from your baby while breastfeeding.  Nipple pain. If you think your baby has not latched on correctly, slip your finger into the corner of your baby's mouth to break the suction and place it between your baby's gums. Attempt breastfeeding initiation again. Signs of Successful Breastfeeding Signs from your baby:   A gradual decrease in the number of sucks or complete cessation of sucking.   Falling asleep.   Relaxation of his or her body.    Retention of a small amount of milk in his or her mouth.   Letting go of your breast by himself or herself. Signs from you:  Breasts that have increased in firmness, weight, and size 1-3 hours after feeding.   Breasts that are softer immediately after breastfeeding.  Increased milk volume, as well as a change in milk consistency and color by the fifth day of breastfeeding.   Nipples that are not sore, cracked, or bleeding. Signs That Your Baby is Getting Enough Milk  Wetting at least 3 diapers in a 24-hour period. The urine should be clear and pale yellow by age 5 days.  At least 3 stools in a 24-hour period by age 5 days. The stool should be soft and yellow.  At least 3 stools in a 24-hour period by age 7 days. The stool should be seedy and yellow.  No loss of weight greater than 10% of birth weight during the first 3 days of age.  Average weight gain of 4-7 ounces (113-198 g) per week after age 4 days.  Consistent daily weight gain by age 5 days, without weight loss after the age of 2 weeks. After a feeding, your baby may spit up a small amount. This is common. BREASTFEEDING FREQUENCY AND DURATION Frequent feeding will help you make more milk and can prevent sore nipples and breast engorgement. Breastfeed when you feel the need to reduce the fullness of your breasts or when your baby shows signs of hunger. This is called "breastfeeding on demand." Avoid introducing a pacifier to your baby while you are working to establish breastfeeding (the first 4-6 weeks after your baby is born). After this time you may choose to use a pacifier. Research has shown that pacifier use during the first year of a baby's life decreases the risk of sudden infant death syndrome (SIDS). Allow your baby to feed on each breast as long as he or she wants. Breastfeed until your baby is finished feeding. When your baby unlatches or falls asleep while feeding from the first breast, offer the second breast.  Because newborns are often sleepy in the   first few weeks of life, you may need to awaken your baby to get him or her to feed. Breastfeeding times will vary from baby to baby. However, the following rules can serve as a guide to help you ensure that your baby is properly fed:  Newborns (babies 4 weeks of age or younger) may breastfeed every 1-3 hours.  Newborns should not go longer than 3 hours during the day or 5 hours during the night without breastfeeding.  You should breastfeed your baby a minimum of 8 times in a 24-hour period until you begin to introduce solid foods to your baby at around 6 months of age. BREAST MILK PUMPING Pumping and storing breast milk allows you to ensure that your baby is exclusively fed your breast milk, even at times when you are unable to breastfeed. This is especially important if you are going back to work while you are still breastfeeding or when you are not able to be present during feedings. Your lactation consultant can give you guidelines on how long it is safe to store breast milk.  A breast pump is a machine that allows you to pump milk from your breast into a sterile bottle. The pumped breast milk can then be stored in a refrigerator or freezer. Some breast pumps are operated by hand, while others use electricity. Ask your lactation consultant which type will work best for you. Breast pumps can be purchased, but some hospitals and breastfeeding support groups lease breast pumps on a monthly basis. A lactation consultant can teach you how to hand express breast milk, if you prefer not to use a pump.  CARING FOR YOUR BREASTS WHILE YOU BREASTFEED Nipples can become dry, cracked, and sore while breastfeeding. The following recommendations can help keep your breasts moisturized and healthy:  Avoid using soap on your nipples.   Wear a supportive bra. Although not required, special nursing bras and tank tops are designed to allow access to your breasts for  breastfeeding without taking off your entire bra or top. Avoid wearing underwire-style bras or extremely tight bras.  Air dry your nipples for 3-4minutes after each feeding.   Use only cotton bra pads to absorb leaked breast milk. Leaking of breast milk between feedings is normal.   Use lanolin on your nipples after breastfeeding. Lanolin helps to maintain your skin's normal moisture barrier. If you use pure lanolin, you do not need to wash it off before feeding your baby again. Pure lanolin is not toxic to your baby. You may also hand express a few drops of breast milk and gently massage that milk into your nipples and allow the milk to air dry. In the first few weeks after giving birth, some women experience extremely full breasts (engorgement). Engorgement can make your breasts feel heavy, warm, and tender to the touch. Engorgement peaks within 3-5 days after you give birth. The following recommendations can help ease engorgement:  Completely empty your breasts while breastfeeding or pumping. You may want to start by applying warm, moist heat (in the shower or with warm water-soaked hand towels) just before feeding or pumping. This increases circulation and helps the milk flow. If your baby does not completely empty your breasts while breastfeeding, pump any extra milk after he or she is finished.  Wear a snug bra (nursing or regular) or tank top for 1-2 days to signal your body to slightly decrease milk production.  Apply ice packs to your breasts, unless this is too uncomfortable for you.    Make sure that your baby is latched on and positioned properly while breastfeeding. If engorgement persists after 48 hours of following these recommendations, contact your health care provider or a lactation consultant. OVERALL HEALTH CARE RECOMMENDATIONS WHILE BREASTFEEDING  Eat healthy foods. Alternate between meals and snacks, eating 3 of each per day. Because what you eat affects your breast milk,  some of the foods may make your baby more irritable than usual. Avoid eating these foods if you are sure that they are negatively affecting your baby.  Drink milk, fruit juice, and water to satisfy your thirst (about 10 glasses a day).   Rest often, relax, and continue to take your prenatal vitamins to prevent fatigue, stress, and anemia.  Continue breast self-awareness checks.  Avoid chewing and smoking tobacco.  Avoid alcohol and drug use. Some medicines that may be harmful to your baby can pass through breast milk. It is important to ask your health care provider before taking any medicine, including all over-the-counter and prescription medicine as well as vitamin and herbal supplements. It is possible to become pregnant while breastfeeding. If birth control is desired, ask your health care provider about options that will be safe for your baby. SEEK MEDICAL CARE IF:   You feel like you want to stop breastfeeding or have become frustrated with breastfeeding.  You have painful breasts or nipples.  Your nipples are cracked or bleeding.  Your breasts are red, tender, or warm.  You have a swollen area on either breast.  You have a fever or chills.  You have nausea or vomiting.  You have drainage other than breast milk from your nipples.  Your breasts do not become full before feedings by the fifth day after you give birth.  You feel sad and depressed.  Your baby is too sleepy to eat well.  Your baby is having trouble sleeping.   Your baby is wetting less than 3 diapers in a 24-hour period.  Your baby has less than 3 stools in a 24-hour period.  Your baby's skin or the white part of his or her eyes becomes yellow.   Your baby is not gaining weight by 5 days of age. SEEK IMMEDIATE MEDICAL CARE IF:   Your baby is overly tired (lethargic) and does not want to wake up and feed.  Your baby develops an unexplained fever. Document Released: 07/09/2005 Document Revised:  07/14/2013 Document Reviewed: 12/31/2012 [employer] Patient Information 2015 [employer]. This information is not intended to replace advice given to you by your health care provider. Make sure you discuss any questions you have with your health care provider.  

## 2014-08-02 ENCOUNTER — Other Ambulatory Visit (HOSPITAL_COMMUNITY): Payer: Self-pay

## 2014-12-28 LAB — OB RESULTS CONSOLE GBS: GBS: POSITIVE

## 2015-01-19 ENCOUNTER — Encounter (HOSPITAL_COMMUNITY): Payer: Self-pay | Admitting: General Practice

## 2015-01-19 ENCOUNTER — Inpatient Hospital Stay (HOSPITAL_COMMUNITY)
Admission: AD | Admit: 2015-01-19 | Discharge: 2015-01-21 | DRG: 775 | Disposition: A | Discharge status: Home / Self Care | Payer: Medicaid Other | Source: Ambulatory Visit | Attending: Obstetrics | Admitting: Obstetrics

## 2015-01-19 ENCOUNTER — Inpatient Hospital Stay (HOSPITAL_COMMUNITY): Admission: RE | Admit: 2015-01-19 | Payer: Medicaid Other | Source: Ambulatory Visit

## 2015-01-19 ENCOUNTER — Inpatient Hospital Stay (HOSPITAL_COMMUNITY): Payer: Medicaid Other | Admitting: Anesthesiology

## 2015-01-19 DIAGNOSIS — O99824 Streptococcus B carrier state complicating childbirth: Secondary | ICD-10-CM | POA: Diagnosis present

## 2015-01-19 DIAGNOSIS — Z3483 Encounter for supervision of other normal pregnancy, third trimester: Secondary | ICD-10-CM | POA: Diagnosis present

## 2015-01-19 DIAGNOSIS — Z3A39 39 weeks gestation of pregnancy: Secondary | ICD-10-CM | POA: Diagnosis present

## 2015-01-19 DIAGNOSIS — Z3491 Encounter for supervision of normal pregnancy, unspecified, first trimester: Secondary | ICD-10-CM

## 2015-01-19 LAB — CBC
HEMATOCRIT: 32.7 % — AB (ref 36.0–46.0)
Hemoglobin: 11.5 g/dL — ABNORMAL LOW (ref 12.0–15.0)
MCH: 31.8 pg (ref 26.0–34.0)
MCHC: 35.2 g/dL (ref 30.0–36.0)
MCV: 90.3 fL (ref 78.0–100.0)
Platelets: 265 10*3/uL (ref 150–400)
RBC: 3.62 MIL/uL — ABNORMAL LOW (ref 3.87–5.11)
RDW: 14.6 % (ref 11.5–15.5)
WBC: 9.4 10*3/uL (ref 4.0–10.5)

## 2015-01-19 LAB — TYPE AND SCREEN
ABO/RH(D): A POS
ANTIBODY SCREEN: NEGATIVE

## 2015-01-19 LAB — ABO/RH: ABO/RH(D): A POS

## 2015-01-19 MED ORDER — ZOLPIDEM TARTRATE 5 MG PO TABS: 5.0000 mg | ORAL_TABLET | Freq: Every evening | ORAL | Status: DC | PRN

## 2015-01-19 MED ORDER — SIMETHICONE 80 MG PO CHEW: 80.0000 mg | CHEWABLE_TABLET | ORAL | Status: DC | PRN

## 2015-01-19 MED ORDER — LIDOCAINE HCL (PF) 1 % IJ SOLN
30.0000 mL | INTRAMUSCULAR | Status: DC | PRN
  Filled 2015-01-19: qty 30

## 2015-01-19 MED ORDER — OXYTOCIN BOLUS FROM INFUSION: 500.0000 mL | INTRAVENOUS | Status: DC

## 2015-01-19 MED ORDER — DIBUCAINE 1 % RE OINT: 1.0000 "application " | TOPICAL_OINTMENT | RECTAL | Status: DC | PRN

## 2015-01-19 MED ORDER — DEXTROSE 5 % IV SOLN
2.5000 10*6.[IU] | INTRAVENOUS | Status: DC
  Filled 2015-01-19 (×5): qty 2.5

## 2015-01-19 MED ORDER — OXYCODONE-ACETAMINOPHEN 5-325 MG PO TABS: 2.0000 | ORAL_TABLET | ORAL | Status: DC | PRN

## 2015-01-19 MED ORDER — LACTATED RINGERS IV SOLN: INTRAVENOUS | Status: DC

## 2015-01-19 MED ORDER — LIDOCAINE HCL (PF) 1 % IJ SOLN
INTRAMUSCULAR | Status: DC | PRN
  Administered 2015-01-19: 10 mL

## 2015-01-19 MED ORDER — FERROUS SULFATE 325 (65 FE) MG PO TABS
325.0000 mg | ORAL_TABLET | Freq: Two times a day (BID) | ORAL | Status: DC
  Administered 2015-01-19 – 2015-01-21 (×4): 325 mg via ORAL
  Filled 2015-01-19 (×4): qty 1

## 2015-01-19 MED ORDER — TERBUTALINE SULFATE 1 MG/ML IJ SOLN
0.2500 mg | Freq: Once | INTRAMUSCULAR | Status: DC | PRN
  Filled 2015-01-19: qty 1

## 2015-01-19 MED ORDER — OXYTOCIN 40 UNITS IN LACTATED RINGERS INFUSION - SIMPLE MED
1.0000 m[IU]/min | INTRAVENOUS | Status: DC
  Administered 2015-01-19: 2 m[IU]/min via INTRAVENOUS
  Filled 2015-01-19: qty 1000

## 2015-01-19 MED ORDER — PENICILLIN G POTASSIUM 5000000 UNITS IJ SOLR
5.0000 10*6.[IU] | Freq: Once | INTRAVENOUS | Status: AC
  Administered 2015-01-19: 5 10*6.[IU] via INTRAVENOUS
  Filled 2015-01-19: qty 5

## 2015-01-19 MED ORDER — TETANUS-DIPHTH-ACELL PERTUSSIS 5-2.5-18.5 LF-MCG/0.5 IM SUSP
0.5000 mL | Freq: Once | INTRAMUSCULAR | Status: AC
  Administered 2015-01-21: 0.5 mL via INTRAMUSCULAR
  Filled 2015-01-19: qty 0.5

## 2015-01-19 MED ORDER — OXYTOCIN 40 UNITS IN LACTATED RINGERS INFUSION - SIMPLE MED: 62.5000 mL/h | INTRAVENOUS | Status: DC

## 2015-01-19 MED ORDER — FENTANYL 2.5 MCG/ML BUPIVACAINE 1/10 % EPIDURAL INFUSION (WH - ANES)
14.0000 mL/h | INTRAMUSCULAR | Status: DC | PRN
  Filled 2015-01-19: qty 125

## 2015-01-19 MED ORDER — IBUPROFEN 600 MG PO TABS
600.0000 mg | ORAL_TABLET | Freq: Four times a day (QID) | ORAL | Status: DC
Start: 2015-01-19 — End: 2015-01-21
  Administered 2015-01-19 – 2015-01-21 (×8): 600 mg via ORAL
  Filled 2015-01-19 (×8): qty 1

## 2015-01-19 MED ORDER — SENNOSIDES-DOCUSATE SODIUM 8.6-50 MG PO TABS
2.0000 | ORAL_TABLET | ORAL | Status: DC
  Administered 2015-01-19 – 2015-01-20 (×2): 2 via ORAL
  Filled 2015-01-19 (×2): qty 2

## 2015-01-19 MED ORDER — BENZOCAINE-MENTHOL 20-0.5 % EX AERO
1.0000 "application " | INHALATION_SPRAY | CUTANEOUS | Status: DC | PRN
  Administered 2015-01-19: 1 via TOPICAL
  Filled 2015-01-19: qty 56

## 2015-01-19 MED ORDER — ONDANSETRON HCL 4 MG/2ML IJ SOLN: 4.0000 mg | INTRAMUSCULAR | Status: DC | PRN

## 2015-01-19 MED ORDER — DIPHENHYDRAMINE HCL 50 MG/ML IJ SOLN: 12.5000 mg | INTRAMUSCULAR | Status: DC | PRN

## 2015-01-19 MED ORDER — ONDANSETRON HCL 4 MG PO TABS
4.0000 mg | ORAL_TABLET | ORAL | Status: DC | PRN
Start: 2015-01-19 — End: 2015-01-21

## 2015-01-19 MED ORDER — OXYCODONE-ACETAMINOPHEN 5-325 MG PO TABS
2.0000 | ORAL_TABLET | ORAL | Status: DC | PRN
  Administered 2015-01-19 – 2015-01-20 (×2): 2 via ORAL
  Filled 2015-01-19 (×2): qty 2

## 2015-01-19 MED ORDER — ACETAMINOPHEN 325 MG PO TABS: 650.0000 mg | ORAL_TABLET | ORAL | Status: DC | PRN

## 2015-01-19 MED ORDER — EPHEDRINE 5 MG/ML INJ
10.0000 mg | INTRAVENOUS | Status: DC | PRN
  Filled 2015-01-19: qty 2

## 2015-01-19 MED ORDER — OXYCODONE-ACETAMINOPHEN 5-325 MG PO TABS: 1.0000 | ORAL_TABLET | ORAL | Status: DC | PRN

## 2015-01-19 MED ORDER — PRENATAL MULTIVITAMIN CH
1.0000 | ORAL_TABLET | Freq: Every day | ORAL | Status: DC
  Administered 2015-01-20 – 2015-01-21 (×2): 1 via ORAL
  Filled 2015-01-19 (×2): qty 1

## 2015-01-19 MED ORDER — WITCH HAZEL-GLYCERIN EX PADS: 1.0000 "application " | MEDICATED_PAD | CUTANEOUS | Status: DC | PRN

## 2015-01-19 MED ORDER — BUTORPHANOL TARTRATE 1 MG/ML IJ SOLN: 1.0000 mg | INTRAMUSCULAR | Status: DC | PRN

## 2015-01-19 MED ORDER — FLEET ENEMA 7-19 GM/118ML RE ENEM: 1.0000 | ENEMA | RECTAL | Status: DC | PRN

## 2015-01-19 MED ORDER — LACTATED RINGERS IV SOLN: 500.0000 mL | INTRAVENOUS | Status: DC | PRN

## 2015-01-19 MED ORDER — ONDANSETRON HCL 4 MG/2ML IJ SOLN: 4.0000 mg | Freq: Four times a day (QID) | INTRAMUSCULAR | Status: DC | PRN

## 2015-01-19 MED ORDER — PHENYLEPHRINE 40 MCG/ML (10ML) SYRINGE FOR IV PUSH (FOR BLOOD PRESSURE SUPPORT)
80.0000 ug | PREFILLED_SYRINGE | INTRAVENOUS | Status: DC | PRN
  Filled 2015-01-19: qty 20
  Filled 2015-01-19: qty 2

## 2015-01-19 MED ORDER — DIPHENHYDRAMINE HCL 25 MG PO CAPS: 25.0000 mg | ORAL_CAPSULE | Freq: Four times a day (QID) | ORAL | Status: DC | PRN

## 2015-01-19 MED ORDER — OXYCODONE-ACETAMINOPHEN 5-325 MG PO TABS
1.0000 | ORAL_TABLET | ORAL | Status: DC | PRN
  Administered 2015-01-20: 1 via ORAL
  Filled 2015-01-19: qty 1

## 2015-01-19 MED ORDER — FENTANYL 2.5 MCG/ML BUPIVACAINE 1/10 % EPIDURAL INFUSION (WH - ANES)
INTRAMUSCULAR | Status: DC | PRN
  Administered 2015-01-19: 14 mL/h via EPIDURAL

## 2015-01-19 MED ORDER — FENTANYL 2.5 MCG/ML BUPIVACAINE 1/10 % EPIDURAL INFUSION (WH - ANES): 14.0000 mL/h | INTRAMUSCULAR | Status: DC | PRN

## 2015-01-19 MED ORDER — LANOLIN HYDROUS EX OINT: TOPICAL_OINTMENT | CUTANEOUS | Status: DC | PRN

## 2015-01-19 MED ORDER — CITRIC ACID-SODIUM CITRATE 334-500 MG/5ML PO SOLN: 30.0000 mL | ORAL | Status: DC | PRN

## 2015-01-19 NOTE — Anesthesia Procedure Notes (Signed)
Epidural Patient location during procedure: OB Start time: 01/19/2015 12:22 PM End time: 01/19/2015 12:41 PM  Staffing Anesthesiologist: Sebastian AcheMANNY, Raia Amico  Preanesthetic Checklist Completed: patient identified, site marked, surgical consent, pre-op evaluation, timeout performed, IV checked, risks and benefits discussed and monitors and equipment checked  Epidural Patient position: sitting Prep: site prepped and draped and DuraPrep Patient monitoring: heart rate, continuous pulse ox and blood pressure Approach: midline Location: L3-L4 Injection technique: LOR saline and LOR air  Needle:  Needle type: Tuohy  Needle gauge: 17 G Needle length: 9 cm and 9 Needle insertion depth: 4.5 cm Catheter type: closed end flexible Catheter size: 19 Gauge Catheter at skin depth: 14 cm Test dose: negative  Assessment Events: blood not aspirated, injection not painful, no injection resistance, negative IV test and no paresthesia  Additional Notes   Patient tolerated the insertion well without complications.Reason for block:procedure for pain

## 2015-01-19 NOTE — Anesthesia Preprocedure Evaluation (Signed)
Anesthesia Evaluation  Patient identified by MRN, date of birth, ID band Patient awake and Patient confused    Reviewed: Allergy & Precautions, H&P , NPO status , Patient's Chart, lab work & pertinent test results  Airway Mallampati: II       Dental   Pulmonary former smoker,  breath sounds clear to auscultation  Pulmonary exam normal       Cardiovascular Exercise Tolerance: Good Normal cardiovascular examRhythm:regular Rate:Normal     Neuro/Psych    GI/Hepatic   Endo/Other    Renal/GU      Musculoskeletal   Abdominal   Peds  Hematology   Anesthesia Other Findings   Reproductive/Obstetrics (+) Pregnancy                             Anesthesia Physical Anesthesia Plan  ASA: II  Anesthesia Plan: Epidural   Post-op Pain Management:    Induction:   Airway Management Planned:   Additional Equipment:   Intra-op Plan:   Post-operative Plan:   Informed Consent: I have reviewed the patients History and Physical, chart, labs and discussed the procedure including the risks, benefits and alternatives for the proposed anesthesia with the patient or authorized representative who has indicated his/her understanding and acceptance.     Plan Discussed with:   Anesthesia Plan Comments:         Anesthesia Quick Evaluation  

## 2015-01-19 NOTE — H&P (Signed)
This is Dr. Francoise CeoBernard Paislynn Hegstrom dictating the history and physical on  Cheryl Daniels  she's a 34 year old gravida 3 para 202 at 8439 weeks and a day EDC 01/25/2015 positive GBS desires induction she has a positive GBS received penicillin her cervix is no 4 cm 90% vertex -2 amniotomy performed the fluids clear Past medical history negative Past surgical history negative Social history negative System review noncontributory Physical exam well-developed female in labor HEENT negative Lungs clear to P&A Heart regular rhythm no murmurs no gallops Breasts negative Abdomen term Pelvic as described above Extremities negative

## 2015-01-20 LAB — CBC
HEMATOCRIT: 31.6 % — AB (ref 36.0–46.0)
Hemoglobin: 11 g/dL — ABNORMAL LOW (ref 12.0–15.0)
MCH: 31.4 pg (ref 26.0–34.0)
MCHC: 34.8 g/dL (ref 30.0–36.0)
MCV: 90.3 fL (ref 78.0–100.0)
Platelets: 258 10*3/uL (ref 150–400)
RBC: 3.5 MIL/uL — ABNORMAL LOW (ref 3.87–5.11)
RDW: 14.5 % (ref 11.5–15.5)
WBC: 9.1 10*3/uL (ref 4.0–10.5)

## 2015-01-20 LAB — RPR: RPR Ser Ql: NONREACTIVE

## 2015-01-20 LAB — CCBB MATERNAL DONOR DRAW

## 2015-01-20 NOTE — Lactation Note (Addendum)
This note was copied from the chart of Girl Cristy FolksSarah Lanzo. Lactation Consultation Note  Patient Name: Girl Cristy FolksSarah Mcquarrie ZOXWR'UToday's Date: 01/20/2015 Reason for consult: Initial assessment Baby 25 hours old. Mom reports that she nursed both her older 2 children, 10 and 9 months respectively. Mom states this baby latching/nursing well. Enc mom to call for assistance as needed. [facility] given [facility], aware of [facility], community resources, and [facility] after [facility]   Maternal Data Has patient been taught Hand Expression?: Yes (Per mom.) Does the patient have breastfeeding experience prior to this delivery?: Yes  Feeding    LATCH Score/Interventions                      Lactation Tools Discussed/Used     Consult Status Consult Status: PRN    Geralynn OchsWILLIARD, Lenwood Balsam 01/20/2015, 4:24 PM

## 2015-01-20 NOTE — Progress Notes (Signed)
Patient ID: Cheryl Daniels, female   DOB: 1981/05/22, 34 y.o.   MRN: [id number] Postpartum day one Blood pressure 134/81 respiration 16 pulse 90 Patient has no complaints Fundus firm Lochia moderate Legs negative doing well

## 2015-01-20 NOTE — Anesthesia Postprocedure Evaluation (Signed)
  Anesthesia Post-op Note  Patient: Cheryl Daniels  Procedure(s) Performed: * No procedures listed *  Patient Location: Mother/Baby  Anesthesia Type:Epidural  Level of Consciousness: awake  Airway and Oxygen Therapy: Patient Spontanous Breathing  Post-op Pain: mild  Post-op Assessment: Patient's Cardiovascular Status Stable and Respiratory Function Stable              Post-op Vital Signs: stable  Last Vitals:  Filed Vitals:   01/20/15 0636  BP: 137/79  Pulse: 75  Temp: 36.9 C  Resp: 16    Complications: No apparent anesthesia complications

## 2015-01-21 NOTE — Discharge Summary (Signed)
Obstetric Discharge Summary Reason for Admission: induction of labor Prenatal Procedures: none Intrapartum Procedures: spontaneous vaginal delivery Postpartum Procedures: none Complications-Operative and Postpartum: none HEMOGLOBIN  Date Value Ref Range Status  01/20/2015 11.0* 12.0 - 15.0 g/dL Final   HCT  Date Value Ref Range Status  01/20/2015 31.6* 36.0 - 46.0 % Final    Physical Exam:  General: alert Lochia: appropriate Uterine Fundus: firm Incision: healing well DVT Evaluation: No evidence of DVT seen on physical exam.  Discharge Diagnoses: Term Pregnancy-delivered  Discharge Information: Date: 01/21/2015 Activity: pelvic rest Diet: routine Medications: Percocet Condition: stable Instructions: refer to practice specific booklet Discharge to: home Follow-up Information    Follow up with Kathreen CosierMARSHALL,Galilee Pierron A, MD.   Specialty:  Obstetrics and Gynecology   Contact information:   [address] [place] [address] [phone]       Newborn Data: Live born female  Birth Weight: 6 lb 3.1 oz (2809 g) APGAR: 9, 9  Home with mother.  Svetlana Bagby A 01/21/2015, 6:35 AM

## 2015-01-21 NOTE — Discharge Instructions (Signed)
Discharge instructions   You can wash your hair  Shower  Eat what you want  Drink what you want  See me in 6 weeks  Your ankles are going to swell more in the next 2 weeks than when pregnant  No sex for 6 weeks   Zoeann Mol A, MD 01/21/2015

## 2015-01-21 NOTE — Progress Notes (Signed)
Patient ID: Nat ChristenSarah L Martorelli, female   DOB: 10-25-1980, 34 y.o.   MRN: [id number] Postpartum date 2 Blood pressure 08/06/1953 respiration 17 and pulse 81 afebrile No complaints Fundus firm Lochia moderate Discharge today

## 2015-01-24 NOTE — Progress Notes (Signed)
Post discharge chart review completed.  

## 2016-02-13 ENCOUNTER — Ambulatory Visit: Payer: Self-pay | Admitting: Obstetrics and Gynecology

## 2016-02-20 ENCOUNTER — Ambulatory Visit: Payer: Self-pay | Admitting: Obstetrics & Gynecology

## 2016-03-22 ENCOUNTER — Ambulatory Visit: Payer: Medicaid Other | Admitting: Obstetrics & Gynecology

## 2016-04-05 ENCOUNTER — Encounter: Payer: Self-pay | Admitting: *Deleted

## 2016-04-05 LAB — PROCEDURE REPORT - SCANNED: Pap: NEGATIVE

## 2016-04-12 ENCOUNTER — Ambulatory Visit: Payer: Medicaid Other | Admitting: Obstetrics and Gynecology

## 2016-05-14 ENCOUNTER — Ambulatory Visit: Payer: Self-pay | Admitting: Obstetrics and Gynecology

## 2017-07-06 ENCOUNTER — Encounter (HOSPITAL_COMMUNITY): Payer: Self-pay | Admitting: Family Medicine

## 2017-07-06 ENCOUNTER — Ambulatory Visit (HOSPITAL_COMMUNITY)
Admission: EM | Admit: 2017-07-06 | Discharge: 2017-07-06 | Disposition: A | Discharge status: Home / Self Care | Payer: Self-pay | Attending: Family Medicine | Admitting: Family Medicine

## 2017-07-06 DIAGNOSIS — S161XXA Strain of muscle, fascia and tendon at neck level, initial encounter: Secondary | ICD-10-CM

## 2017-07-06 DIAGNOSIS — S39012A Strain of muscle, fascia and tendon of lower back, initial encounter: Secondary | ICD-10-CM

## 2017-07-06 MED ORDER — DICLOFENAC SODIUM 75 MG PO TBEC: 75.0000 mg | DELAYED_RELEASE_TABLET | Freq: Two times a day (BID) | ORAL | 0 refills | Status: DC
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
## 2018-08-06 ENCOUNTER — Ambulatory Visit (INDEPENDENT_AMBULATORY_CARE_PROVIDER_SITE_OTHER): Payer: Medicaid Other

## 2018-08-06 VITALS — BP 131/86 | HR 88 | Ht 63.0 in | Wt 127.0 lb

## 2018-08-06 DIAGNOSIS — Z3201 Encounter for pregnancy test, result positive: Secondary | ICD-10-CM | POA: Diagnosis not present

## 2018-08-06 DIAGNOSIS — Z3480 Encounter for supervision of other normal pregnancy, unspecified trimester: Secondary | ICD-10-CM | POA: Insufficient documentation

## 2018-08-06 LAB — POCT URINE PREGNANCY: PREG TEST UR: POSITIVE — AB

## 2018-08-06 NOTE — Progress Notes (Signed)
Cheryl Daniels presents today for UPT. She has no unusual complaints . LMP: 06/05/2018     OBJECTIVE: Appears well, in no apparent distress.  OB History    Gravida  4   Para  3   Term  3   Preterm      AB      Living  3     SAB      TAB      Ectopic      Multiple  0   Live Births  3          Home UPT Result: POSITIVE In-Office UPT result: POSITIVE I have reviewed the patient's medical, obstetrical, social, and family histories, and medications.   ASSESSMENT: Positive pregnancy test  PLAN Prenatal care to be completed at: [facility]

## 2018-08-21 ENCOUNTER — Other Ambulatory Visit: Payer: Self-pay | Admitting: Obstetrics and Gynecology

## 2018-08-21 ENCOUNTER — Other Ambulatory Visit (HOSPITAL_COMMUNITY)
Admission: RE | Admit: 2018-08-21 | Discharge: 2018-08-21 | Disposition: A | Discharge status: Home / Self Care | Payer: Medicaid Other | Source: Ambulatory Visit | Attending: Obstetrics and Gynecology | Admitting: Obstetrics and Gynecology

## 2018-08-21 ENCOUNTER — Ambulatory Visit (INDEPENDENT_AMBULATORY_CARE_PROVIDER_SITE_OTHER): Payer: Medicaid Other | Admitting: Obstetrics and Gynecology

## 2018-08-21 ENCOUNTER — Encounter: Payer: Self-pay | Admitting: Obstetrics and Gynecology

## 2018-08-21 DIAGNOSIS — O09529 Supervision of elderly multigravida, unspecified trimester: Secondary | ICD-10-CM | POA: Insufficient documentation

## 2018-08-21 DIAGNOSIS — Z3480 Encounter for supervision of other normal pregnancy, unspecified trimester: Secondary | ICD-10-CM

## 2018-08-21 DIAGNOSIS — Z3481 Encounter for supervision of other normal pregnancy, first trimester: Secondary | ICD-10-CM

## 2018-08-21 DIAGNOSIS — O09521 Supervision of elderly multigravida, first trimester: Secondary | ICD-10-CM

## 2018-08-21 DIAGNOSIS — O10911 Unspecified pre-existing hypertension complicating pregnancy, first trimester: Secondary | ICD-10-CM

## 2018-08-21 DIAGNOSIS — O10919 Unspecified pre-existing hypertension complicating pregnancy, unspecified trimester: Secondary | ICD-10-CM

## 2018-08-21 MED ORDER — PRENATE PIXIE 10-0.6-0.4-200 MG PO CAPS: 1.0000 | ORAL_CAPSULE | Freq: Every day | ORAL | 11 refills | Status: DC

## 2018-08-21 MED ORDER — ASPIRIN EC 81 MG PO TBEC: 81.0000 mg | DELAYED_RELEASE_TABLET | Freq: Every day | ORAL | 2 refills | Status: DC

## 2018-08-21 NOTE — Patient Instructions (Signed)
First Trimester of Pregnancy  The first trimester of pregnancy is from week 1 until the end of week 13 (months 1 through 3). A week after a sperm fertilizes an egg, the egg will implant on the wall of the uterus. This embryo will begin to develop into a baby. Genes from you and your partner will form the baby. The female genes will determine whether the baby will be a boy or a girl. At 6-8 weeks, the eyes and face will be formed, and the heartbeat can be seen on ultrasound. At the end of 12 weeks, all the baby's organs will be formed.  Now that you are pregnant, you will want to do everything you can to have a healthy baby. Two of the most important things are to get good prenatal care and to follow your health care provider's instructions. Prenatal care is all the medical care you receive before the baby's birth. This care will help prevent, find, and treat any problems during the pregnancy and childbirth.  Body changes during your first trimester  Your body goes through many changes during pregnancy. The changes vary from woman to woman.   You may gain or lose a couple of pounds at first.   You may feel sick to your stomach (nauseous) and you may throw up (vomit). If the vomiting is uncontrollable, call your health care provider.   You may tire easily.   You may develop headaches that can be relieved by medicines. All medicines should be approved by your health care provider.   You may urinate more often. Painful urination may mean you have a bladder infection.   You may develop heartburn as a result of your pregnancy.   You may develop constipation because certain hormones are causing the muscles that push stool through your intestines to slow down.   You may develop hemorrhoids or swollen veins (varicose veins).   Your breasts may begin to grow larger and become tender. Your nipples may stick out more, and the tissue that surrounds them (areola) may become darker.   Your gums may bleed and may be  sensitive to brushing and flossing.   Dark spots or blotches (chloasma, mask of pregnancy) may develop on your face. This will likely fade after the baby is born.   Your menstrual periods will stop.   You may have a loss of appetite.   You may develop cravings for certain kinds of food.   You may have changes in your emotions from day to day, such as being excited to be pregnant or being concerned that something may go wrong with the pregnancy and baby.   You may have more vivid and strange dreams.   You may have changes in your hair. These can include thickening of your hair, rapid growth, and changes in texture. Some women also have hair loss during or after pregnancy, or hair that feels dry or thin. Your hair will most likely return to normal after your baby is born.  What to expect at prenatal visits  During a routine prenatal visit:   You will be weighed to make sure you and the baby are growing normally.   Your blood pressure will be taken.   Your abdomen will be measured to track your baby's growth.   The fetal heartbeat will be listened to between weeks 10 and 14 of your pregnancy.   Test results from any previous visits will be discussed.  Your health care provider may ask you:     How you are feeling.   If you are feeling the baby move.   If you have had any abnormal symptoms, such as leaking fluid, bleeding, severe headaches, or abdominal cramping.   If you are using any tobacco products, including cigarettes, chewing tobacco, and electronic cigarettes.   If you have any questions.  Other tests that may be performed during your first trimester include:   Blood tests to find your blood type and to check for the presence of any previous infections. The tests will also be used to check for low iron levels (anemia) and protein on red blood cells (Rh antibodies). Depending on your risk factors, or if you previously had diabetes during pregnancy, you may have tests to check for high blood sugar  that affects pregnant women (gestational diabetes).   Urine tests to check for infections, diabetes, or protein in the urine.   An ultrasound to confirm the proper growth and development of the baby.   Fetal screens for spinal cord problems (spina bifida) and Down syndrome.   HIV (human immunodeficiency virus) testing. Routine prenatal testing includes screening for HIV, unless you choose not to have this test.   You may need other tests to make sure you and the baby are doing well.  Follow these instructions at home:  Medicines   Follow your health care provider's instructions regarding medicine use. Specific medicines may be either safe or unsafe to take during pregnancy.   Take a prenatal vitamin that contains at least 600 micrograms (mcg) of folic acid.   If you develop constipation, try taking a stool softener if your health care provider approves.  Eating and drinking     Eat a balanced diet that includes fresh fruits and vegetables, whole grains, good sources of protein such as meat, eggs, or tofu, and low-fat dairy. Your health care provider will help you determine the amount of weight gain that is right for you.   Avoid raw meat and uncooked cheese. These carry germs that can cause birth defects in the baby.   Eating four or five small meals rather than three large meals a day may help relieve nausea and vomiting. If you start to feel nauseous, eating a few soda crackers can be helpful. Drinking liquids between meals, instead of during meals, also seems to help ease nausea and vomiting.   Limit foods that are high in fat and processed sugars, such as fried and sweet foods.   To prevent constipation:  ? Eat foods that are high in fiber, such as fresh fruits and vegetables, whole grains, and beans.  ? Drink enough fluid to keep your urine clear or pale yellow.  Activity   Exercise only as directed by your health care provider. Most women can continue their usual exercise routine during  pregnancy. Try to exercise for 30 minutes at least 5 days a week. Exercising will help you:  ? Control your weight.  ? Stay in shape.  ? Be prepared for labor and delivery.   Experiencing pain or cramping in the lower abdomen or lower back is a good sign that you should stop exercising. Check with your health care provider before continuing with normal exercises.   Try to avoid standing for long periods of time. Move your legs often if you must stand in one place for a long time.   Avoid heavy lifting.   Wear low-heeled shoes and practice good posture.   You may continue to have sex unless your health care   provider tells you not to.  Relieving pain and discomfort   Wear a good support bra to relieve breast tenderness.   Take warm sitz baths to soothe any pain or discomfort caused by hemorrhoids. Use hemorrhoid cream if your health care provider approves.   Rest with your legs elevated if you have leg cramps or low back pain.   If you develop varicose veins in your legs, wear support hose. Elevate your feet for 15 minutes, 3-4 times a day. Limit salt in your diet.  Prenatal care   Schedule your prenatal visits by the twelfth week of pregnancy. They are usually scheduled monthly at first, then more often in the last 2 months before delivery.   Write down your questions. Take them to your prenatal visits.   Keep all your prenatal visits as told by your health care provider. This is important.  Safety   Wear your seat belt at all times when driving.   Make a list of emergency phone numbers, including numbers for family, friends, the hospital, and police and fire departments.  General instructions   Ask your health care provider for a referral to a local prenatal education class. Begin classes no later than the beginning of month 6 of your pregnancy.   Ask for help if you have counseling or nutritional needs during pregnancy. Your health care provider can offer advice or refer you to specialists for help  with various needs.   Do not use hot tubs, steam rooms, or saunas.   Do not douche or use tampons or scented sanitary pads.   Do not cross your legs for long periods of time.   Avoid cat litter boxes and soil used by cats. These carry germs that can cause birth defects in the baby and possibly loss of the fetus by miscarriage or stillbirth.   Avoid all smoking, herbs, alcohol, and medicines not prescribed by your health care provider. Chemicals in these products affect the formation and growth of the baby.   Do not use any products that contain nicotine or tobacco, such as cigarettes and e-cigarettes. If you need help quitting, ask your health care provider. You may receive counseling support and other resources to help you quit.   Schedule a dentist appointment. At home, brush your teeth with a soft toothbrush and be gentle when you floss.  Contact a health care provider if:   You have dizziness.   You have mild pelvic cramps, pelvic pressure, or nagging pain in the abdominal area.   You have persistent nausea, vomiting, or diarrhea.   You have a bad smelling vaginal discharge.   You have pain when you urinate.   You notice increased swelling in your face, hands, legs, or ankles.   You are exposed to fifth disease or chickenpox.   You are exposed to German measles (rubella) and have never had it.  Get help right away if:   You have a fever.   You are leaking fluid from your vagina.   You have spotting or bleeding from your vagina.   You have severe abdominal cramping or pain.   You have rapid weight gain or loss.   You vomit blood or material that looks like coffee grounds.   You develop a severe headache.   You have shortness of breath.   You have any kind of trauma, such as from a fall or a car accident.  Summary   The first trimester of pregnancy is from week 1 until   the end of week 13 (months 1 through 3).   Your body goes through many changes during pregnancy. The changes vary from  woman to woman.   You will have routine prenatal visits. During those visits, your health care provider will examine you, discuss any test results you may have, and talk with you about how you are feeling.  This information is not intended to replace advice given to you by your health care provider. Make sure you discuss any questions you have with your health care provider.  Document Released: 07/03/2001 Document Revised: 06/20/2016 Document Reviewed: 06/20/2016  Elsevier Interactive Patient Education  2019 [employer]
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
## 2018-08-27 LAB — CYTOLOGY - PAP
Bacterial vaginitis: POSITIVE — AB
CANDIDA VAGINITIS: NEGATIVE
CHLAMYDIA, DNA PROBE: NEGATIVE
Diagnosis: NEGATIVE
Neisseria Gonorrhea: NEGATIVE
TRICH (WINDOWPATH): NEGATIVE

## 2018-08-28 ENCOUNTER — Other Ambulatory Visit: Payer: Self-pay

## 2018-08-28 DIAGNOSIS — B9689 Other specified bacterial agents as the cause of diseases classified elsewhere: Secondary | ICD-10-CM

## 2018-08-28 DIAGNOSIS — N760 Acute vaginitis: Principal | ICD-10-CM

## 2018-08-28 MED ORDER — METRONIDAZOLE 500 MG PO TABS: 500.0000 mg | ORAL_TABLET | Freq: Two times a day (BID) | ORAL | 0 refills | Status: DC

## 2018-08-28 NOTE — Progress Notes (Signed)
Rx sent as directed by provider for BV.

## 2018-09-01 LAB — COMPREHENSIVE METABOLIC PANEL
ALK PHOS: 75 IU/L (ref 39–117)
ALT: 13 IU/L (ref 0–32)
AST: 21 IU/L (ref 0–40)
Albumin/Globulin Ratio: 1.8 (ref 1.2–2.2)
Albumin: 4.5 g/dL (ref 3.8–4.8)
BUN/Creatinine Ratio: 18 (ref 9–23)
BUN: 11 mg/dL (ref 6–20)
Bilirubin Total: 0.3 mg/dL (ref 0.0–1.2)
CALCIUM: 9.8 mg/dL (ref 8.7–10.2)
CO2: 20 mmol/L (ref 20–29)
CREATININE: 0.62 mg/dL (ref 0.57–1.00)
Chloride: 98 mmol/L (ref 96–106)
GFR calc Af Amer: 135 mL/min/{1.73_m2} (ref >59)
GFR calc non Af Amer: 117 mL/min/{1.73_m2} (ref >59)
GLOBULIN, TOTAL: 2.5 g/dL (ref 1.5–4.5)
Glucose: 81 mg/dL (ref 65–99)
Potassium: 3.6 mmol/L (ref 3.5–5.2)
Sodium: 135 mmol/L (ref 134–144)
Total Protein: 7 g/dL (ref 6.0–8.5)

## 2018-09-01 LAB — SMN1 COPY NUMBER ANALYSIS (SMA CARRIER SCREENING)

## 2018-09-01 LAB — OBSTETRIC PANEL, INCLUDING HIV
ANTIBODY SCREEN: NEGATIVE
BASOS ABS: 0 10*3/uL (ref 0.0–0.2)
Basos: 1 %
EOS (ABSOLUTE): 0.2 10*3/uL (ref 0.0–0.4)
Eos: 2 %
HIV Screen 4th Generation wRfx: NONREACTIVE
Hematocrit: 34.7 % (ref 34.0–46.6)
Hemoglobin: 10.9 g/dL — ABNORMAL LOW (ref 11.1–15.9)
Hepatitis B Surface Ag: NEGATIVE
IMMATURE GRANS (ABS): 0 10*3/uL (ref 0.0–0.1)
Immature Granulocytes: 0 %
LYMPHS: 31 %
Lymphocytes Absolute: 2 10*3/uL (ref 0.7–3.1)
MCH: 24.5 pg — ABNORMAL LOW (ref 26.6–33.0)
MCHC: 31.4 g/dL — ABNORMAL LOW (ref 31.5–35.7)
MCV: 78 fL — ABNORMAL LOW (ref 79–97)
MONOS ABS: 0.5 10*3/uL (ref 0.1–0.9)
Monocytes: 8 %
Neutrophils Absolute: 3.7 10*3/uL (ref 1.4–7.0)
Neutrophils: 58 %
PLATELETS: 486 10*3/uL — AB (ref 150–450)
RBC: 4.44 x10E6/uL (ref 3.77–5.28)
RDW: 19.9 % — AB (ref 11.7–15.4)
RPR Ser Ql: NONREACTIVE
RUBELLA: 1.59 {index} (ref >0.99)
Rh Factor: POSITIVE
WBC: 6.4 10*3/uL (ref 3.4–10.8)

## 2018-09-01 LAB — HEMOGLOBINOPATHY EVALUATION
HEMOGLOBIN A2 QUANTITATION: 1.7 % — AB (ref 1.8–3.2)
HGB C: 0 %
HGB S: 0 %
HGB VARIANT: 0 %
Hemoglobin F Quantitation: 0 % (ref 0.0–2.0)
Hgb A: 98.3 % (ref 96.4–98.8)

## 2018-09-01 LAB — PROTEIN / CREATININE RATIO, URINE
Creatinine, Urine: 426.1 mg/dL
Protein, Ur: 39.2 mg/dL
Protein/Creat Ratio: 92 mg/g creat (ref 0–200)

## 2018-09-01 LAB — CYSTIC FIBROSIS MUTATION 97: GENE DIS ANAL CARRIER INTERP BLD/T-IMP: NOT DETECTED

## 2018-09-01 LAB — TSH: TSH: 0.792 u[IU]/mL (ref 0.450–4.500)

## 2018-09-01 LAB — CULTURE, OB URINE

## 2018-09-01 LAB — URINE CULTURE, OB REFLEX

## 2018-09-18 ENCOUNTER — Encounter: Payer: Medicaid Other | Admitting: Certified Nurse Midwife

## 2018-09-25 ENCOUNTER — Encounter: Payer: Medicaid Other | Admitting: Obstetrics & Gynecology

## 2018-09-25 NOTE — Progress Notes (Deleted)
   Patient did not show up today for her scheduled appointment.   Alpa Salvo, MD, FACOG Obstetrician & Gynecologist, [facility], [facility]  

## 2018-09-30 ENCOUNTER — Telehealth: Payer: Self-pay | Admitting: *Deleted

## 2018-09-30 NOTE — Telephone Encounter (Signed)
I called patient and left a message to return my call to rescheduling a missed appointment.

## 2018-12-01 ENCOUNTER — Ambulatory Visit: Payer: Medicaid Other | Admitting: Obstetrics

## 2019-06-04 ENCOUNTER — Encounter (HOSPITAL_COMMUNITY): Payer: Self-pay

## 2021-06-16 ENCOUNTER — Emergency Department (HOSPITAL_COMMUNITY)
Admission: EM | Admit: 2021-06-16 | Discharge: 2021-06-16 | Disposition: A | Discharge status: Home / Self Care | Payer: Medicaid Other

## 2021-06-16 ENCOUNTER — Emergency Department (HOSPITAL_COMMUNITY): Payer: Medicaid Other

## 2021-06-16 ENCOUNTER — Encounter (HOSPITAL_COMMUNITY): Payer: Self-pay

## 2021-06-16 ENCOUNTER — Inpatient Hospital Stay (HOSPITAL_COMMUNITY)
Admission: EM | Admit: 2021-06-16 | Discharge: 2021-06-19 | DRG: 394 | Disposition: A | Discharge status: Home / Self Care | Payer: Medicaid Other | Attending: Internal Medicine | Admitting: Internal Medicine

## 2021-06-16 ENCOUNTER — Other Ambulatory Visit: Payer: Self-pay

## 2021-06-16 DIAGNOSIS — K648 Other hemorrhoids: Secondary | ICD-10-CM | POA: Diagnosis present

## 2021-06-16 DIAGNOSIS — I10 Essential (primary) hypertension: Secondary | ICD-10-CM | POA: Diagnosis present

## 2021-06-16 DIAGNOSIS — R42 Dizziness and giddiness: Secondary | ICD-10-CM

## 2021-06-16 DIAGNOSIS — D500 Iron deficiency anemia secondary to blood loss (chronic): Secondary | ICD-10-CM | POA: Diagnosis present

## 2021-06-16 DIAGNOSIS — F17210 Nicotine dependence, cigarettes, uncomplicated: Secondary | ICD-10-CM | POA: Diagnosis present

## 2021-06-16 DIAGNOSIS — E876 Hypokalemia: Secondary | ICD-10-CM | POA: Diagnosis present

## 2021-06-16 DIAGNOSIS — R519 Headache, unspecified: Secondary | ICD-10-CM | POA: Diagnosis present

## 2021-06-16 DIAGNOSIS — D649 Anemia, unspecified: Secondary | ICD-10-CM | POA: Diagnosis present

## 2021-06-16 DIAGNOSIS — R112 Nausea with vomiting, unspecified: Secondary | ICD-10-CM | POA: Diagnosis present

## 2021-06-16 DIAGNOSIS — K449 Diaphragmatic hernia without obstruction or gangrene: Secondary | ICD-10-CM | POA: Diagnosis present

## 2021-06-16 DIAGNOSIS — I160 Hypertensive urgency: Secondary | ICD-10-CM | POA: Diagnosis present

## 2021-06-16 DIAGNOSIS — G8929 Other chronic pain: Secondary | ICD-10-CM | POA: Diagnosis present

## 2021-06-16 DIAGNOSIS — F1090 Alcohol use, unspecified, uncomplicated: Secondary | ICD-10-CM | POA: Diagnosis present

## 2021-06-16 DIAGNOSIS — Z8249 Family history of ischemic heart disease and other diseases of the circulatory system: Secondary | ICD-10-CM

## 2021-06-16 DIAGNOSIS — K529 Noninfective gastroenteritis and colitis, unspecified: Secondary | ICD-10-CM | POA: Diagnosis present

## 2021-06-16 DIAGNOSIS — R21 Rash and other nonspecific skin eruption: Secondary | ICD-10-CM | POA: Diagnosis present

## 2021-06-16 DIAGNOSIS — Z800 Family history of malignant neoplasm of digestive organs: Secondary | ICD-10-CM

## 2021-06-16 DIAGNOSIS — Z20822 Contact with and (suspected) exposure to covid-19: Secondary | ICD-10-CM | POA: Diagnosis present

## 2021-06-16 DIAGNOSIS — R5383 Other fatigue: Secondary | ICD-10-CM | POA: Diagnosis present

## 2021-06-16 DIAGNOSIS — K2100 Gastro-esophageal reflux disease with esophagitis, without bleeding: Secondary | ICD-10-CM | POA: Diagnosis present

## 2021-06-16 DIAGNOSIS — K649 Unspecified hemorrhoids: Secondary | ICD-10-CM | POA: Diagnosis present

## 2021-06-16 DIAGNOSIS — F1010 Alcohol abuse, uncomplicated: Secondary | ICD-10-CM | POA: Diagnosis present

## 2021-06-16 DIAGNOSIS — Z789 Other specified health status: Secondary | ICD-10-CM | POA: Diagnosis present

## 2021-06-16 DIAGNOSIS — K921 Melena: Secondary | ICD-10-CM | POA: Diagnosis present

## 2021-06-16 DIAGNOSIS — K219 Gastro-esophageal reflux disease without esophagitis: Secondary | ICD-10-CM | POA: Diagnosis present

## 2021-06-16 DIAGNOSIS — E538 Deficiency of other specified B group vitamins: Secondary | ICD-10-CM | POA: Diagnosis present

## 2021-06-16 DIAGNOSIS — J45909 Unspecified asthma, uncomplicated: Secondary | ICD-10-CM | POA: Diagnosis present

## 2021-06-16 DIAGNOSIS — Z791 Long term (current) use of non-steroidal anti-inflammatories (NSAID): Secondary | ICD-10-CM

## 2021-06-16 DIAGNOSIS — K625 Hemorrhage of anus and rectum: Secondary | ICD-10-CM | POA: Diagnosis present

## 2021-06-16 DIAGNOSIS — R079 Chest pain, unspecified: Secondary | ICD-10-CM | POA: Diagnosis present

## 2021-06-16 LAB — TROPONIN I (HIGH SENSITIVITY)
Troponin I (High Sensitivity): <2 ng/L (ref <18)
Troponin I (High Sensitivity): 4 ng/L (ref <18)

## 2021-06-16 LAB — POC OCCULT BLOOD, ED: Fecal Occult Bld: POSITIVE — AB

## 2021-06-16 LAB — CBC WITH DIFFERENTIAL/PLATELET
Abs Immature Granulocytes: 0.03 10*3/uL (ref 0.00–0.07)
Basophils Absolute: 0.1 10*3/uL (ref 0.0–0.1)
Basophils Relative: 1 %
Eosinophils Absolute: 0.2 10*3/uL (ref 0.0–0.5)
Eosinophils Relative: 3 %
HCT: 27.8 % — ABNORMAL LOW (ref 36.0–46.0)
Hemoglobin: 7.7 g/dL — ABNORMAL LOW (ref 12.0–15.0)
Immature Granulocytes: 1 %
Lymphocytes Relative: 14 %
Lymphs Abs: 0.9 10*3/uL (ref 0.7–4.0)
MCH: 20.7 pg — ABNORMAL LOW (ref 26.0–34.0)
MCHC: 27.7 g/dL — ABNORMAL LOW (ref 30.0–36.0)
MCV: 74.7 fL — ABNORMAL LOW (ref 80.0–100.0)
Monocytes Absolute: 0.3 10*3/uL (ref 0.1–1.0)
Monocytes Relative: 6 %
Neutro Abs: 4.5 10*3/uL (ref 1.7–7.7)
Neutrophils Relative %: 75 %
Platelets: 453 10*3/uL — ABNORMAL HIGH (ref 150–400)
RBC: 3.72 MIL/uL — ABNORMAL LOW (ref 3.87–5.11)
RDW: 21.6 % — ABNORMAL HIGH (ref 11.5–15.5)
WBC: 6 10*3/uL (ref 4.0–10.5)
nRBC: 1 % — ABNORMAL HIGH (ref 0.0–0.2)

## 2021-06-16 LAB — RETICULOCYTES
Immature Retic Fract: 36.8 % — ABNORMAL HIGH (ref 2.3–15.9)
RBC.: 3.61 MIL/uL — ABNORMAL LOW (ref 3.87–5.11)
Retic Count, Absolute: 63.2 10*3/uL (ref 19.0–186.0)
Retic Ct Pct: 1.8 % (ref 0.4–3.1)

## 2021-06-16 LAB — IRON AND TIBC
Iron: 385 ug/dL — ABNORMAL HIGH (ref 28–170)
Saturation Ratios: 91 % — ABNORMAL HIGH (ref 10.4–31.8)
TIBC: 424 ug/dL (ref 250–450)
UIBC: 39 ug/dL

## 2021-06-16 LAB — RESP PANEL BY RT-PCR (FLU A&B, COVID) ARPGX2
Influenza A by PCR: NEGATIVE
Influenza B by PCR: NEGATIVE
SARS Coronavirus 2 by RT PCR: NEGATIVE

## 2021-06-16 LAB — I-STAT BETA HCG BLOOD, ED (MC, WL, AP ONLY): I-stat hCG, quantitative: <5 m[IU]/mL (ref <5)

## 2021-06-16 LAB — COMPREHENSIVE METABOLIC PANEL
ALT: 16 U/L (ref 0–44)
AST: 25 U/L (ref 15–41)
Albumin: 4 g/dL (ref 3.5–5.0)
Alkaline Phosphatase: 93 U/L (ref 38–126)
Anion gap: 11 (ref 5–15)
BUN: 6 mg/dL (ref 6–20)
CO2: 21 mmol/L — ABNORMAL LOW (ref 22–32)
Calcium: 8.9 mg/dL (ref 8.9–10.3)
Chloride: 104 mmol/L (ref 98–111)
Creatinine, Ser: 0.59 mg/dL (ref 0.44–1.00)
GFR, Estimated: >60 mL/min (ref >60)
Glucose, Bld: 108 mg/dL — ABNORMAL HIGH (ref 70–99)
Potassium: 3.3 mmol/L — ABNORMAL LOW (ref 3.5–5.1)
Sodium: 136 mmol/L (ref 135–145)
Total Bilirubin: 0.7 mg/dL (ref 0.3–1.2)
Total Protein: 7.3 g/dL (ref 6.5–8.1)

## 2021-06-16 LAB — VITAMIN B12: Vitamin B-12: 374 pg/mL (ref 180–914)

## 2021-06-16 LAB — TSH: TSH: 1.429 u[IU]/mL (ref 0.350–4.500)

## 2021-06-16 LAB — PREPARE RBC (CROSSMATCH)

## 2021-06-16 LAB — FOLATE: Folate: 12.7 ng/mL (ref >5.9)

## 2021-06-16 LAB — MAGNESIUM: Magnesium: 1.9 mg/dL (ref 1.7–2.4)

## 2021-06-16 LAB — FERRITIN: Ferritin: 430 ng/mL — ABNORMAL HIGH (ref 11–307)

## 2021-06-16 LAB — LIPASE, BLOOD: Lipase: 25 U/L (ref 11–51)

## 2021-06-16 MED ORDER — SODIUM CHLORIDE 0.9 % IV SOLN: 10.0000 mL/h | Freq: Once | INTRAVENOUS | Status: DC

## 2021-06-16 MED ORDER — ACETAMINOPHEN 325 MG PO TABS
650.0000 mg | ORAL_TABLET | Freq: Four times a day (QID) | ORAL | Status: DC | PRN
  Administered 2021-06-17 – 2021-06-18 (×2): 650 mg via ORAL
  Filled 2021-06-16 (×2): qty 2

## 2021-06-16 MED ORDER — ACETAMINOPHEN 500 MG PO TABS
1000.0000 mg | ORAL_TABLET | Freq: Four times a day (QID) | ORAL | Status: DC | PRN
  Administered 2021-06-16: 1000 mg via ORAL
  Filled 2021-06-16: qty 2

## 2021-06-16 MED ORDER — ONDANSETRON HCL 4 MG/2ML IJ SOLN: 4.0000 mg | Freq: Four times a day (QID) | INTRAMUSCULAR | Status: DC | PRN

## 2021-06-16 MED ORDER — ACETAMINOPHEN 650 MG RE SUPP: 650.0000 mg | Freq: Four times a day (QID) | RECTAL | Status: DC | PRN

## 2021-06-16 MED ORDER — AMLODIPINE BESYLATE 5 MG PO TABS
5.0000 mg | ORAL_TABLET | Freq: Every day | ORAL | Status: DC
  Administered 2021-06-17: 5 mg via ORAL
  Filled 2021-06-16: qty 1

## 2021-06-16 MED ORDER — PANTOPRAZOLE SODIUM 40 MG IV SOLR
40.0000 mg | Freq: Two times a day (BID) | INTRAVENOUS | Status: DC
  Administered 2021-06-17 – 2021-06-18 (×4): 40 mg via INTRAVENOUS
  Filled 2021-06-16 (×4): qty 40

## 2021-06-16 MED ORDER — ONDANSETRON HCL 4 MG PO TABS: 4.0000 mg | ORAL_TABLET | Freq: Four times a day (QID) | ORAL | Status: DC | PRN

## 2021-06-16 NOTE — ED Notes (Signed)
[facility] Chaplain stated that the consult for Advance Directives will be taken care of on am shift

## 2021-06-16 NOTE — ED Notes (Signed)
  Patient tolerating transfusion well.  Rate increased to 175 ml/hr.

## 2021-06-16 NOTE — Assessment & Plan Note (Signed)
Likely secondary to daily use of Goody powder as well as alcohol abuse. Continue PPI.

## 2021-06-16 NOTE — Assessment & Plan Note (Signed)
Reports chest pain prior to arrival.  Felt like tightening.  Currently resolved.  Likely GI origin.  EKG unremarkable.

## 2021-06-16 NOTE — Assessment & Plan Note (Signed)
Patient presented with complaints of fatigue and tiredness as well as dizziness. Found to have a hemoglobin of 7.7. Likely the cause of her symptoms. Patient has chronic GI blood loss and associated iron deficiency anemia. Patient has iron levels and significant change. Also has folic acid deficiency as well. Patient uses Marlin Canary powder almost on a daily basis for headache which is located in the frontal region. Patient reports every other day BRBPR on her wipes or intermittently sees a clot in her stool.  Reports acid reflux which she treats normally with milk. Patient does see hematologist at [facility] for iron deficiency and receives IV iron infusion. Patient does report a history of hemorrhoids which might be the cause of her positive FOBT. Patient was referred to GI by her hematologist but has not seen them. Patient had reported nausea and vomiting as well without any blood. Will consult GI, initiate PPI twice daily, patient received 1 PRBC transfusion.  Patient will continue with IV iron infusion at [facility] scheduled next Wednesday.

## 2021-06-16 NOTE — Assessment & Plan Note (Signed)
Patient reports chronic frontal headache for which he takes 1-2 Goody's per day. Currently no headache.

## 2021-06-16 NOTE — ED Provider Notes (Signed)
[facility] DEPT Provider Note   CSN: [id number] Arrival date & time: 06/16/21  1437     History Chief Complaint  Patient presents with   Hypertension   Headache   Dizziness    Cheryl Daniels is a 40 y.o. female.  HPI Patient is a 40 year old female with a past medical history significant for iron deficiency anemia also thought to have some folate deficiency although this seems to have been corrected by her hematologist.  She states that she has been receiving ongoing iron transfusions however she states for the past 2 or 3 days she has been having lightheadedness, dyspnea on exertion and fatigue.  She states that when she stands up she feels woozy but does not feel that she is about to pass out.  She denies any vertigo.  Denies any nausea vomiting or diarrhea.  States that she just started her menstrual cycle today with some light vaginal bleeding denies any gum bleeding hematemesis or rectal bleeding states that she occasionally has somewhat dark appearing stool states that she has not been using Pepto-Bismol and is no longer taking iron supplementation.  She states she has had a total of 3 iron transfusions  Has never had a blood transfusion  She is also complaining of elevated blood pressure states that she has been prescribed blood pressure medication but does not take it.  Also prescribed propranolol although she states this is not for elevated blood pressure and states that she does not take this.  Complains of some low back pain that she states she frequently has when her iron is low.  She denies any history of IV drug use denies any fevers and denies any numbness or weakness of her lower extremities.  Denies any urinary symptoms such as urgency frequency or hematuria.  Does endorse some chest pain but describes it more as chest pressure with shortness of breath with exertion.   Past Medical History:  Diagnosis Date   Asthma    Back injuries     Hypertension     Patient Active Problem List   Diagnosis Date Noted   Symptomatic anemia 06/16/2021   AMA (advanced maternal age) multigravida 35+ 08/21/2018   Chronic hypertension affecting pregnancy 08/21/2018   Supervision of other normal pregnancy, antepartum 08/06/2018    Past Surgical History:  Procedure Laterality Date   NO PAST SURGERIES       OB History     Gravida  4   Para  3   Term  3   Preterm      AB      Living  3      SAB      IAB      Ectopic      Multiple  0   Live Births  3           Family History  Problem Relation Age of Onset   Hypertension Father     Social History   Tobacco Use   Smoking status: Former    Packs/day: 0.50    Years: 10.00    Pack years: 5.00    Types: Cigarettes    Quit date: 06/06/2014    Years since quitting: 7.0   Smokeless tobacco: Never  Vaping Use   Vaping Use: Never used  Substance Use Topics   Alcohol use: No   Drug use: Not Currently    Types: Marijuana    Comment: daily prior to pregnancy    Home Medications  Prior to Admission medications   Medication Sig Start Date End Date Taking? Authorizing Provider  Aspirin-Salicylamide-Caffeine (BC HEADACHE PO) Take 1 packet by mouth daily as needed (headache and back pain).   Yes [provider]  aspirin EC 81 MG tablet Take 1 tablet (81 mg total) by mouth daily. Take after 12 weeks for prevention of preeclampsia later in pregnancy Patient not taking: Reported on 06/16/2021 08/21/18   Chancy Milroy, MD  metroNIDAZOLE (FLAGYL) 500 MG tablet Take 1 tablet (500 mg total) by mouth 2 (two) times daily. Patient not taking: Reported on 06/16/2021 08/28/18   Chancy Milroy, MD  Prenat-FeAsp-Meth-FA-DHA w/o A (PRENATE PIXIE) 10-0.6-0.4-200 MG CAPS Take 1 capsule by mouth daily. Patient not taking: Reported on 06/16/2021 08/21/18   Chancy Milroy, MD    Allergies    Patient has no known allergies.  Review of Systems   Review of Systems   Constitutional:  Positive for fatigue. Negative for chills and fever.  HENT:  Negative for congestion.   Eyes:  Negative for pain.  Respiratory:  Positive for chest tightness and shortness of breath. Negative for cough.   Cardiovascular:  Negative for chest pain and leg swelling.  Gastrointestinal:  Negative for abdominal pain, diarrhea, nausea and vomiting.  Genitourinary:  Negative for dysuria.  Musculoskeletal:  Positive for back pain. Negative for myalgias.  Skin:  Negative for rash.  Neurological:  Positive for headaches. Negative for dizziness.   Physical Exam Updated Vital Signs BP (!) 151/95   Pulse 90   Temp 98.2 F (36.8 C) (Oral)   Resp 20   Ht '5\' 3"'  (1.6 m)   Wt 72.6 kg   LMP 06/16/2021   SpO2 100%   BMI 28.34 kg/m   Physical Exam Vitals and nursing note reviewed.  Constitutional:      General: She is not in acute distress. HENT:     Head: Normocephalic and atraumatic.     Nose: Nose normal.  Eyes:     General: No scleral icterus.    Comments: Pale conjunctive a  Cardiovascular:     Rate and Rhythm: Regular rhythm. Tachycardia present.     Pulses: Normal pulses.     Heart sounds: Normal heart sounds.     Comments: Heart rate 105 during my examination increases to 120-130 with standing Pulmonary:     Effort: Pulmonary effort is normal. No respiratory distress.     Breath sounds: Normal breath sounds. No wheezing.  Abdominal:     General: There is no distension.     Palpations: Abdomen is soft.     Tenderness: There is no abdominal tenderness.  Genitourinary:    Comments: EMT present for chaperone for rectal exam.  Circumferential hemorrhoids soft brown stool in rectal vault. Musculoskeletal:     Cervical back: Normal range of motion.     Right lower leg: No edema.     Left lower leg: No edema.  Skin:    General: Skin is warm and dry.     Capillary Refill: Capillary refill takes less than 2 seconds.  Neurological:     Mental Status: She is alert.  Mental status is at baseline.  Psychiatric:        Mood and Affect: Mood normal.        Behavior: Behavior normal.    ED Results / Procedures / Treatments   Labs (all labs ordered are listed, but only abnormal results are displayed) Labs Reviewed  CBC WITH DIFFERENTIAL/PLATELET -  Abnormal; Notable for the following components:      Result Value   RBC 3.72 (*)    Hemoglobin 7.7 (*)    HCT 27.8 (*)    MCV 74.7 (*)    MCH 20.7 (*)    MCHC 27.7 (*)    RDW 21.6 (*)    Platelets 453 (*)    nRBC 1.0 (*)    All other components within normal limits  COMPREHENSIVE METABOLIC PANEL - Abnormal; Notable for the following components:   Potassium 3.3 (*)    CO2 21 (*)    Glucose, Bld 108 (*)    All other components within normal limits  IRON AND TIBC - Abnormal; Notable for the following components:   Iron 385 (*)    Saturation Ratios 91 (*)    All other components within normal limits  FERRITIN - Abnormal; Notable for the following components:   Ferritin 430 (*)    All other components within normal limits  RETICULOCYTES - Abnormal; Notable for the following components:   RBC. 3.61 (*)    Immature Retic Fract 36.8 (*)    All other components within normal limits  POC OCCULT BLOOD, ED - Abnormal; Notable for the following components:   Fecal Occult Bld POSITIVE (*)    All other components within normal limits  RESP PANEL BY RT-PCR (FLU A&B, COVID) ARPGX2  LIPASE, BLOOD  MAGNESIUM  TSH  VITAMIN B12  FOLATE  URINALYSIS, ROUTINE W REFLEX MICROSCOPIC  I-STAT BETA HCG BLOOD, ED ([facility], [facility], [facility] ONLY)  PREPARE RBC (CROSSMATCH)  TYPE AND SCREEN  TROPONIN I (HIGH SENSITIVITY)  TROPONIN I (HIGH SENSITIVITY)    EKG EKG Interpretation  Date/Time:  Friday June 16 2021 14:49:25 EST Ventricular Rate:  103 PR Interval:  167 QRS Duration: 90 QT Interval:  344 QTC Calculation: 451 R Axis:   82 Text Interpretation: Sinus tachycardia Low voltage, precordial leads Probable anteroseptal  infarct, old No old tracing to compare Confirmed by Isla Pence [id number]) on 06/16/2021 7:56:55 PM  Radiology DG Chest 2 View  Result Date: 06/16/2021 CLINICAL DATA:  Chest pain, fever, cough EXAM: CHEST - 2 VIEW COMPARISON:  None. FINDINGS: The heart size and mediastinal contours are within normal limits. Both lungs are clear. The visualized skeletal structures are unremarkable. IMPRESSION: No active cardiopulmonary disease. Electronically Signed   By: Elmer Picker M.D.   On: 06/16/2021 15:46   CT HEAD WO CONTRAST (5MM)  Result Date: 06/16/2021 CLINICAL DATA:  Chronic headache. EXAM: CT HEAD WITHOUT CONTRAST TECHNIQUE: Contiguous axial images were obtained from the base of the skull through the vertex without intravenous contrast. COMPARISON:  None. FINDINGS: Brain: No evidence of acute infarction, hemorrhage, hydrocephalus, extra-axial collection or mass lesion/mass effect. Vascular: No hyperdense vessel or unexpected calcification. Skull: Normal. Negative for fracture or focal lesion. Sinuses/Orbits: Globes and orbits are unremarkable. Visualized sinuses are clear. Other: None. IMPRESSION: Normal unenhanced CT scan of the brain. Electronically Signed   By: Lajean Manes M.D.   On: 06/16/2021 15:31    Procedures .Critical Care Performed by: Tedd Sias, PA Authorized by: Tedd Sias, PA   Critical care provider statement:    Critical care time (minutes):  35   Critical care time was exclusive of:  Separately billable procedures and treating other patients and teaching time   Critical care was necessary to treat or prevent imminent or life-threatening deterioration of the following conditions: Symptomatic anemia requiring transfusion.   Critical care was time spent personally  by me on the following activities:  Development of treatment plan with patient or surrogate, review of old charts, re-evaluation of patient's condition, pulse oximetry, ordering and review of  radiographic studies, ordering and review of laboratory studies, ordering and performing treatments and interventions, obtaining history from patient or surrogate, examination of patient and evaluation of patient's response to treatment   Care discussed with: admitting provider     Medications Ordered in ED Medications  0.9 %  sodium chloride infusion (has no administration in time range)  acetaminophen (TYLENOL) tablet 1,000 mg (1,000 mg Oral Given 06/16/21 2100)    ED Course  I have reviewed the triage vital signs and the nursing notes.  Pertinent labs & imaging results that were available during my care of the patient were reviewed by me and considered in my medical decision making (see chart for details).  Clinical Course as of 06/16/21 2227  Fri Jun 16, 2021  1818 Hemoglobin(!): 7.7 [BH]  2048 Discussed with Dr. Posey Pronto who will admit.  [WF]    Clinical Course User Index [BH] Henderly, Britni A, PA-C [WF] Tedd Sias, [place]   MDM Rules/Calculators/A&P                          Patient is 40 year old female presented to the ER today with myriad of symptoms seems that what is brought her to the emergency room today however is worsening shortness of breath on exertion lightheadedness fatigue and feeling unwell.  She is also complaining of some elevated blood pressures.  On my examination I adjusted blood pressure cuff and uncrossed her legs and repeated blood pressure to find 151/95.  She is mildly tachycardic this increases with standing or sitting up.  She is pale conjunctiva and appears somewhat fatigued but nontoxic-appearing.  Abdominal exam is benign rectal exam is notable for soft brown stool no melena hematochezia large circumferential hemorrhoids  Patient is experiencing symptomatic anemia Will provide patient of 1 unit of PRBC and admit to medicine.  CBC with hemoglobin of 7.7 she is tachycardic and is experiencing symptoms of her anemia CMP with mild hypokalemia we will hold  off on repletion as I am giving her PRBC this may increase on its own with mild hemolysis.  Lipase mag i-STAT hCG all unremarkable she is not pregnant.  Troponin x2 unremarkable TSH unremarkable COVID influenza negative.  Type and screen ordered by myself.  Anemia panel added on as well.  Occult stool positive but soft light brown likely be false negative or from hemorrhoidal bleeding   Chest x-ray and CT head unremarkable.  Patient's headache is likely from anemia   Provide patient with as needed Tylenol  Patient admitted to Dr. Posey Pronto.  Final Clinical Impression(s) / ED Diagnoses Final diagnoses:  Symptomatic anemia    Rx / DC Orders ED Discharge Orders     None        Tedd Sias, [place] 06/16/21 2231    Isla Pence, MD 06/16/21 2254

## 2021-06-16 NOTE — ED Notes (Signed)
Consult call for call back to [place] PA discontinued per PA request

## 2021-06-16 NOTE — Assessment & Plan Note (Signed)
Likely the cause of chronic blood loss.  Monitor.

## 2021-06-16 NOTE — Assessment & Plan Note (Signed)
Patient mentions that her face develops erythematous patchy rashes after vomiting.  Recommend monitoring for now.

## 2021-06-16 NOTE — ED Triage Notes (Signed)
Patient c/o hypertension ( 164/114 while at home), headache, and lightheadedness x 2 days.

## 2021-06-16 NOTE — ED Provider Notes (Signed)
Emergency Medicine Provider Triage Evaluation Note  Cheryl Daniels , a 39 y.o. female  was evaluated in triage.  Pt complains of multiple complaints.  States she was diagnosed with high blood pressure on Wednesday.  Was called in a medicine however has not started this yet.  Patient states she has been having headaches, multiple episodes of NBNB emesis, chest pain, palpitations, feels generally unwell.  No paresthesias, unilateral weakness, facial droop.Abdominal pain.  Denies chance of pregnancy, urinary complaint  Review of Systems  Positive: Headache, general weakness, emesis, chest pain, palpitations Negative: Fever, shortness of breath, numbness  Physical Exam  BP (!) 150/114 (BP Location: Left Arm)   Pulse (!) 117   Temp 98.2 F (36.8 C) (Oral)   Resp 18   SpO2 99%  Gen:   Awake, no distress   Resp:  Normal effort  MSK:   Moves extremities without difficulty  Neuro:  CN 2-12 grossly intact, ambulatory without difficulty Other:   Medical Decision Making  Medically screening exam initiated at 2:54 PM.  Appropriate orders placed.  Cheryl Daniels was informed that the remainder of the evaluation will be completed by another provider, this initial triage assessment does not replace that evaluation, and the importance of remaining in the ED until their evaluation is complete.  Elevated blood pressure, headaches, chest pain, palpitations, generalized weakness   Jaamal Farooqui A, PA-C 06/16/21 1456    Terald Sleeper, MD 06/16/21 1614

## 2021-06-16 NOTE — Assessment & Plan Note (Signed)
No focal deficit.  Likely secondary to hypertension.  Monitor

## 2021-06-16 NOTE — Assessment & Plan Note (Signed)
Patient reports 2-4 loose bowel movements a day ongoing for last many years. Denies any weight loss. Denies any abdominal pain as well. Check C. difficile.  Monitor.

## 2021-06-16 NOTE — Assessment & Plan Note (Signed)
Educated patient regarding risk of this medication.

## 2021-06-16 NOTE — Assessment & Plan Note (Signed)
Drinks 2- 16 ounce bottles of wine a day. CIWA protocol.  Educated regarding alcohol induced gastritis.

## 2021-06-16 NOTE — Assessment & Plan Note (Signed)
Likely associated with GERD.  Monitor.

## 2021-06-16 NOTE — H&P (Signed)
History and Physical   Patient: Cheryl Daniels DSK:[id number] DOB: February 14, 1981 DOA: 06/16/2021 PCP: Murlean Caller, MD  DOS: the patient was seen and examined on 06/16/2021  Patient coming from: Home  Chief Complaint: Fatigue and tiredness  HPI: Cheryl Daniels is a 40 y.o. female with Past medical history of HTN, chronic iron deficiency anemia. Patient presented with complaints of dizziness, fatigue, shortness of breath and intermittent chest pain. She says that the symptoms have been ongoing for last 2 weeks progressively worsening. Patient received IV iron therapy 1 week ago and ever since she started having complaints of swelling of the leg as well as dizziness. She has history of high blood pressure and does not take any medication at her baseline but she has noticed that her blood pressure has been running high since her IV iron therapy as well. She denies any chest pain at the time of my evaluation but reported that few hours ago she had a small minor episode of chest pain lasted for few minutes felt like tightness and was in center chest. She has chronic GERD and takes milk for that. He has chronic frontal headache and takes Goody's powder for that. She drinks 16 ounce of wine bottle x2 every day. She reports that she has seen blood on her wipe almost every day, once or twice a week she will also notice a clot in her stool.  Intermittently she will notice blood in her toilet plan as well.  She reports 3-4 loose watery bowel movement every day for last many years and has not seek any help for that. She tells me that she has quit smoking, her last cigarette was early thi week.  She tells me that 1 cigarette pack lasts her for 3 days. Reported history of nausea as well as 1 episode of vomiting early this morning currently no nausea. She does not take any medication other than folic acid at her baseline.  Review of Systems: As mentioned in the history of present illness. All other systems  reviewed and are negative.  Past Medical History:  Diagnosis Date   Asthma    Back injuries    Hypertension    Past Surgical History:  Procedure Laterality Date   NO PAST SURGERIES     Social History:  reports that she quit smoking about 7 years ago. She has a 5.00 pack-year smoking history. She has never used smokeless tobacco. She reports that she does not currently use drugs after having used the following drugs: Marijuana. She reports that she does not drink alcohol.  No Known Allergies  Family history reviewed and not pertinent Family History  Problem Relation Age of Onset   Hypertension Father     Prior to Admission medications   Medication Sig Start Date End Date Taking? Authorizing Provider  Aspirin-Salicylamide-Caffeine (BC HEADACHE PO) Take 1 packet by mouth daily as needed (headache and back pain).   Yes [provider]    Physical Exam: Vitals:   06/16/21 2259 06/16/21 2300 06/16/21 2315 06/16/21 2345  BP: (!) 158/93 (!) 158/93 (!) 160/95 (!) 163/95  Pulse: 84 84 92 93  Resp: 12 11 15 18   Temp: 98.3 F (36.8 C) 98.3 F (36.8 C)  98.3 F (36.8 C)  TempSrc: Oral Oral  Oral  SpO2: 100% 100% 100% 98%  Weight:      Height:        General: alert and oriented to time, place, and person. Appear in mild distress, affect appropriate  Eyes: PERRL, Conjunctiva normal ENT: Oral Mucosa Clear, moist  Neck: NO JVD, NO Abnormal Mass Or lumps Cardiovascular: S1 and S2 Present, no Murmur, peripheral pulses symmetrical Respiratory: good respiratory effort, Bilateral Air entry equal and Decreased, no signs of accessory muscle use, Clear to Auscultation, no Crackles, no wheezes Abdomen: Bowel Sound present, Soft and no tenderness, no hernia Skin: no rashes no Extremities: trace Pedal edema, no calf tenderness Neurologic: without any new focal findings Gait not checked due to patient safety concerns  Data Reviewed: I have personally reviewed and interpreted labs,  imaging as discussed below.  CBC: Recent Labs  Lab 06/16/21 1549  WBC 6.0  NEUTROABS 4.5  HGB 7.7*  HCT 27.8*  MCV 74.7*  PLT 453*   Basic Metabolic Panel: Recent Labs  Lab 06/16/21 1549  NA 136  K 3.3*  CL 104  CO2 21*  GLUCOSE 108*  BUN 6  CREATININE 0.59  CALCIUM 8.9  MG 1.9   GFR: Estimated Creatinine Clearance: 91.1 mL/min (by C-G formula based on SCr of 0.59 mg/dL). Liver Function Tests: Recent Labs  Lab 06/16/21 1549  AST 25  ALT 16  ALKPHOS 93  BILITOT 0.7  PROT 7.3  ALBUMIN 4.0   Recent Labs  Lab 06/16/21 1549  LIPASE 25   No results for input(s): AMMONIA in the last 168 hours. Coagulation Profile: No results for input(s): INR, PROTIME in the last 168 hours. Cardiac Enzymes: No results for input(s): CKTOTAL, CKMB, CKMBINDEX, TROPONINI in the last 168 hours. BNP (last 3 results) No results for input(s): PROBNP in the last 8760 hours. HbA1C: No results for input(s): HGBA1C in the last 72 hours. CBG: No results for input(s): GLUCAP in the last 168 hours. Lipid Profile: No results for input(s): CHOL, HDL, LDLCALC, TRIG, CHOLHDL, LDLDIRECT in the last 72 hours. Thyroid Function Tests: Recent Labs    06/16/21 2027  TSH 1.429   Anemia Panel: Recent Labs    06/16/21 2027  VITAMINB12 374  FOLATE 12.7  FERRITIN 430*  TIBC 424  IRON 385*  RETICCTPCT 1.8   Urine analysis:    Component Value Date/Time   COLORURINE YELLOW 09/13/2009 1802   APPEARANCEUR CLEAR 09/13/2009 1802   LABSPEC 1.025 09/13/2009 1802   PHURINE 6.5 09/13/2009 1802   GLUCOSEU NEGATIVE 09/13/2009 1802   HGBUR LARGE (A) 09/13/2009 1802   BILIRUBINUR NEGATIVE 09/13/2009 1802   KETONESUR NEGATIVE 09/13/2009 1802   PROTEINUR 30 (A) 09/13/2009 1802   UROBILINOGEN 0.2 09/13/2009 1802   NITRITE NEGATIVE 09/13/2009 1802   LEUKOCYTESUR SMALL (A) 09/13/2009 1802    Radiological Exams on Admission: DG Chest 2 View  Result Date: 06/16/2021 CLINICAL DATA:  Chest pain,  fever, cough EXAM: CHEST - 2 VIEW COMPARISON:  None. FINDINGS: The heart size and mediastinal contours are within normal limits. Both lungs are clear. The visualized skeletal structures are unremarkable. IMPRESSION: No active cardiopulmonary disease. Electronically Signed   By: Ernie Avena M.D.   On: 06/16/2021 15:46   CT HEAD WO CONTRAST ( )  Result Date: 06/16/2021 CLINICAL DATA:  Chronic headache. EXAM: CT HEAD WITHOUT CONTRAST TECHNIQUE: Contiguous axial images were obtained from the base of the skull through the vertex without intravenous contrast. COMPARISON:  None. FINDINGS: Brain: No evidence of acute infarction, hemorrhage, hydrocephalus, extra-axial collection or mass lesion/mass effect. Vascular: No hyperdense vessel or unexpected calcification. Skull: Normal. Negative for fracture or focal lesion. Sinuses/Orbits: Globes and orbits are unremarkable. Visualized sinuses are clear. Other: None. IMPRESSION: Normal unenhanced CT scan  of the brain. Electronically Signed   By: Amie Portland M.D.   On: 06/16/2021 15:31   EKG: Independently reviewed. normal EKG, normal sinus rhythm.  I reviewed all nursing notes, pharmacy notes, vitals, pertinent old records.  Assessment/Plan * Symptomatic anemia Patient presented with complaints of fatigue and tiredness as well as dizziness. Found to have a hemoglobin of 7.7. Likely the cause of her symptoms. Patient has chronic GI blood loss and associated iron deficiency anemia. Patient has iron levels and significant change. Also has folic acid deficiency as well. Patient uses [place] powder almost on a daily basis for headache which is located in the frontal region. Patient reports every other day BRBPR on her wipes or intermittently sees a clot in her stool.  Reports acid reflux which she treats normally with milk. Patient does see hematologist at [facility] for iron deficiency and receives IV iron infusion. Patient does report a history of  hemorrhoids which might be the cause of her positive FOBT. Patient was referred to GI by her hematologist but has not seen them. Patient had reported nausea and vomiting as well without any blood. Will consult GI, initiate PPI twice daily, patient received 1 PRBC transfusion.  Patient will continue with IV iron infusion at [facility] scheduled next Wednesday.  Folate deficiency Continue folate supplementation  long-term use of Goody's Educated patient regarding risk of this medication.  GERD (gastroesophageal reflux disease) Likely secondary to daily use of Goody powder as well as alcohol abuse. Continue PPI.  Iron deficiency anemia due to chronic blood loss Receives IV iron infusion outpatient.  Hemorrhoids Likely the cause of chronic blood loss.  Monitor.  Hypertensive urgency Patient has prior history of hypertension but does not take any medication.  She does not have any focal deficit or any other organ damage. Initiate Norvasc.  Nausea & vomiting Likely associated with GERD.  Monitor.  Facial rash Patient mentions that her face develops erythematous patchy rashes after vomiting.  Recommend monitoring for now.  Dizziness No focal deficit.  Likely secondary to hypertension.  Monitor  Chronic headache Patient reports chronic frontal headache for which he takes 1-2 Goody's per day. Currently no headache.  Chronic diarrhea Patient reports 2-4 loose bowel movements a day ongoing for last many years. Denies any weight loss. Denies any abdominal pain as well. Check C. difficile.  Monitor.  Alcohol use Drinks 2- 16 ounce bottles of wine a day. CIWA protocol.  Educated regarding alcohol induced gastritis.  Chest pain Reports chest pain prior to arrival.  Felt like tightening.  Currently resolved.  Likely GI origin.  EKG unremarkable.   Nutrition: Clear liquid diet DVT Prophylaxis: SCD, pharmacological prophylaxis contraindicated due to concern for GI bleed  Advance goals  of care discussion:   Code Status: Full Code   Consults: I personally Discussed with [facility]  Family Communication: family was present at bedside, at the time of interview.  Opportunity was given to ask question and all questions were answered satisfactorily.   Disposition:  From: Home Likely will need Home on discharge.   Author: Lynden Oxford, MD [facility] 06/16/2021 11:58 PM   To reach On-call, see care teams to locate the attending and reach out to them via [email or web address]. If 7PM-7AM, please contact night-coverage If you still have difficulty reaching the attending provider, please page the [employer] (Director on Call) for [facility] on amion for assistance.

## 2021-06-16 NOTE — Assessment & Plan Note (Addendum)
Continue folate supplementation. 

## 2021-06-16 NOTE — Assessment & Plan Note (Signed)
Patient has prior history of hypertension but does not take any medication.  She does not have any focal deficit or any other organ damage. Initiate Norvasc.

## 2021-06-16 NOTE — Assessment & Plan Note (Signed)
Receives IV iron infusion outpatient.

## 2021-06-17 DIAGNOSIS — D649 Anemia, unspecified: Secondary | ICD-10-CM | POA: Diagnosis not present

## 2021-06-17 LAB — CBC WITH DIFFERENTIAL/PLATELET
Abs Immature Granulocytes: 0.05 10*3/uL (ref 0.00–0.07)
Basophils Absolute: 0 10*3/uL (ref 0.0–0.1)
Basophils Relative: 1 %
Eosinophils Absolute: 0.3 10*3/uL (ref 0.0–0.5)
Eosinophils Relative: 4 %
HCT: 29 % — ABNORMAL LOW (ref 36.0–46.0)
Hemoglobin: 8.3 g/dL — ABNORMAL LOW (ref 12.0–15.0)
Immature Granulocytes: 1 %
Lymphocytes Relative: 22 %
Lymphs Abs: 1.6 10*3/uL (ref 0.7–4.0)
MCH: 22 pg — ABNORMAL LOW (ref 26.0–34.0)
MCHC: 28.6 g/dL — ABNORMAL LOW (ref 30.0–36.0)
MCV: 76.7 fL — ABNORMAL LOW (ref 80.0–100.0)
Monocytes Absolute: 0.5 10*3/uL (ref 0.1–1.0)
Monocytes Relative: 7 %
Neutro Abs: 4.8 10*3/uL (ref 1.7–7.7)
Neutrophils Relative %: 65 %
Platelets: 420 10*3/uL — ABNORMAL HIGH (ref 150–400)
RBC: 3.78 MIL/uL — ABNORMAL LOW (ref 3.87–5.11)
RDW: 20.8 % — ABNORMAL HIGH (ref 11.5–15.5)
WBC: 7.2 10*3/uL (ref 4.0–10.5)
nRBC: 1 % — ABNORMAL HIGH (ref 0.0–0.2)

## 2021-06-17 LAB — URINALYSIS, ROUTINE W REFLEX MICROSCOPIC
Bilirubin Urine: NEGATIVE
Glucose, UA: NEGATIVE mg/dL
Hgb urine dipstick: NEGATIVE
Ketones, ur: 20 mg/dL — AB
Leukocytes,Ua: NEGATIVE
Nitrite: NEGATIVE
Protein, ur: NEGATIVE mg/dL
Specific Gravity, Urine: 1.032 — ABNORMAL HIGH (ref 1.005–1.030)
pH: 5 (ref 5.0–8.0)

## 2021-06-17 LAB — COMPREHENSIVE METABOLIC PANEL
ALT: 14 U/L (ref 0–44)
AST: 24 U/L (ref 15–41)
Albumin: 3.7 g/dL (ref 3.5–5.0)
Alkaline Phosphatase: 85 U/L (ref 38–126)
Anion gap: 10 (ref 5–15)
BUN: 8 mg/dL (ref 6–20)
CO2: 22 mmol/L (ref 22–32)
Calcium: 8.6 mg/dL — ABNORMAL LOW (ref 8.9–10.3)
Chloride: 103 mmol/L (ref 98–111)
Creatinine, Ser: 0.53 mg/dL (ref 0.44–1.00)
GFR, Estimated: >60 mL/min (ref >60)
Glucose, Bld: 82 mg/dL (ref 70–99)
Potassium: 3 mmol/L — ABNORMAL LOW (ref 3.5–5.1)
Sodium: 135 mmol/L (ref 135–145)
Total Bilirubin: 0.3 mg/dL (ref 0.3–1.2)
Total Protein: 6.8 g/dL (ref 6.5–8.1)

## 2021-06-17 LAB — HEMOGLOBIN AND HEMATOCRIT, BLOOD
HCT: 32.3 % — ABNORMAL LOW (ref 36.0–46.0)
Hemoglobin: 9.4 g/dL — ABNORMAL LOW (ref 12.0–15.0)

## 2021-06-17 LAB — HIV ANTIBODY (ROUTINE TESTING W REFLEX): HIV Screen 4th Generation wRfx: NONREACTIVE

## 2021-06-17 MED ORDER — LORAZEPAM 2 MG/ML IJ SOLN: 1.0000 mg | INTRAMUSCULAR | Status: DC | PRN

## 2021-06-17 MED ORDER — LORAZEPAM 1 MG PO TABS: 1.0000 mg | ORAL_TABLET | ORAL | Status: DC | PRN

## 2021-06-17 MED ORDER — FOLIC ACID 1 MG PO TABS
1.0000 mg | ORAL_TABLET | Freq: Every day | ORAL | Status: DC
  Administered 2021-06-17 – 2021-06-19 (×3): 1 mg via ORAL
  Filled 2021-06-17 (×3): qty 1

## 2021-06-17 MED ORDER — THIAMINE HCL 100 MG/ML IJ SOLN: 100.0000 mg | Freq: Every day | INTRAMUSCULAR | Status: DC

## 2021-06-17 MED ORDER — SODIUM CHLORIDE 0.9 % IV SOLN: INTRAVENOUS | Status: DC

## 2021-06-17 MED ORDER — PEG-KCL-NACL-NASULF-NA ASC-C 100 G PO SOLR: 1.0000 | Freq: Once | ORAL | Status: DC

## 2021-06-17 MED ORDER — POTASSIUM CHLORIDE CRYS ER 20 MEQ PO TBCR
30.0000 meq | EXTENDED_RELEASE_TABLET | ORAL | Status: AC
  Administered 2021-06-17 (×3): 30 meq via ORAL
  Filled 2021-06-17 (×3): qty 1

## 2021-06-17 MED ORDER — PEG-KCL-NACL-NASULF-NA ASC-C 100 G PO SOLR
0.5000 | Freq: Once | ORAL | Status: AC
  Administered 2021-06-17: 100 g via ORAL
  Filled 2021-06-17: qty 1

## 2021-06-17 MED ORDER — PEG-KCL-NACL-NASULF-NA ASC-C 100 G PO SOLR
0.5000 | Freq: Once | ORAL | Status: AC
  Administered 2021-06-17: 100 g via ORAL

## 2021-06-17 MED ORDER — ADULT MULTIVITAMIN W/MINERALS CH
1.0000 | ORAL_TABLET | Freq: Every day | ORAL | Status: DC
  Administered 2021-06-17 – 2021-06-19 (×3): 1 via ORAL
  Filled 2021-06-17 (×3): qty 1

## 2021-06-17 MED ORDER — HYDRALAZINE HCL 25 MG PO TABS: 25.0000 mg | ORAL_TABLET | Freq: Four times a day (QID) | ORAL | Status: DC | PRN

## 2021-06-17 MED ORDER — THIAMINE HCL 100 MG PO TABS
100.0000 mg | ORAL_TABLET | Freq: Every day | ORAL | Status: DC
  Administered 2021-06-17 – 2021-06-19 (×3): 100 mg via ORAL
  Filled 2021-06-17 (×3): qty 1

## 2021-06-17 MED ORDER — AMLODIPINE BESYLATE 10 MG PO TABS
10.0000 mg | ORAL_TABLET | Freq: Every day | ORAL | Status: DC
  Administered 2021-06-18 – 2021-06-19 (×2): 10 mg via ORAL
  Filled 2021-06-17 (×2): qty 1

## 2021-06-17 NOTE — Progress Notes (Signed)
PROGRESS NOTE    Cheryl Daniels  VZS:[id number] DOB: 05-May-1981 DOA: 06/16/2021 PCP: Murlean Caller, MD    Brief Narrative:  Cheryl Daniels is a 40 year old female with past medical history significant for essential hypertension, chronic iron deficiency anemia, chronic headache, history of hemorrhoids, NSAID abuse, alcohol use, tobacco use disorder who presents to [facility] ED on 11/25 with complaint of dizziness, fatigue, shortness of breath and intermittent chest pain.  Onset 2 weeks ago with progressive worsening.  Also reports chronic frontal headache in which she takes [place] powder frequently.  Also endorses EtOH use daily with 16 ounces of wine.  Also reports blood in her stool usually daily.  In the ED, temperature 98.2 F, HR 117, RR 18, BP 162/112, SPO2 100% on room air.WBC 6.0, hemoglobin 7.7, platelets 453.  Sodium 136, potassium 3.3, chloride 104, CO2 21, glucose 108, BUN 6, creatinine 0.59.  AST 25, ALT 25.  Lipase 25, total bilirubin 0.7.  High sensitive troponin <2, 4.  hCG negative.  TSH 1.429.  Covid-19 PCR negative.  Influenza A/B PCR negative.  CT head without contrast with no acute intracranial abnormality.  EKG with sinus tachycardia, rate 103, QTc 451, no concerning dynamic changes.  Order for 1 unit PRBC by EDP.  TRH consulted for further evaluation and management of symptomatic anemia with concern for GI bleed given Goody powder use   Assessment & Plan:   Principal Problem:   Symptomatic anemia Active Problems:   Hypertensive urgency   Chest pain   Dizziness   Chronic diarrhea   Hemorrhoids   BRBPR (bright red blood per rectum)   Iron deficiency anemia due to chronic blood loss   Alcohol use   GERD (gastroesophageal reflux disease)   Chronic headache   long-term use of Goody's   Folate deficiency   Facial rash   Nausea & vomiting   Symptomatic anemia Hx IDA Patient presenting to the ED with dizziness, shortness of breath and intermittent chest pain.  History of  IDA, follows with [facility] hematology with last iron infusion on 06/14/2021.  Hemoglobin 7.7 on admission.  Anemia panel with iron 385, TIBC 424, ferritin 430, folate 12.7, B12 374.  FOBT positive.  Complicated by continuous use of Goody powder for frontal headache.  Concern for gastric ulcer.  Patient also reports history of hemorrhoids.  Denies heavy menses. --[facility] consulted; Dr. Dulce Sellar; consideration of endoscopy inpatient versus outpatient --Hgb 7.7>>8.3 following 1 unit PRBC 11/25 --Iron stores sufficient, no need for repeat iron infusion at this time --Protonix 40 mg IV every 12 hours --H&H every 12 hours --Discussed discontinuation of NSAIDs  Hypertensive urgency Patient with persistent elevated blood pressure, currently not on antihypertensives.  Discussed with patient likely need to reinitiate antihypertensive therapy given her persistent headaches which is likely related. --Increase Amlodipine to 10 mg p.o. daily --Hydralazine 25 mg p.o. q6h PRN SBP >160 or DBP >110  Hypokalemia Potassium 3.0 this morning.  Will replete. --Repeat electrolytes in the a.m.  EtOH use disorder Patient reports daily alcohol use with 16 ounces of wine daily.  Denies history of withdrawal symptoms. --Counseled on need for cessation given her anemia --Multivitamin, thiamine, folic acid  Tobacco use disorder Reports quitting 7 years ago.  Continue tobacco cessation   DVT prophylaxis: SCDs Start: 06/16/21 2340   Code Status: Full Code Family Communication: no family present at bedside  Disposition Plan:  Level of care: Med-Surg Status is: Inpatient  Remains inpatient appropriate because: Awaiting GI evaluation, need  further monitoring of H&H to ensure stability.   Consultants:  [facility] gastroenterology, Dr. Dulce Sellar  Procedures:  None  Antimicrobials:  None   Subjective: Patient seen examined bedside, resting comfortably.  Shortness of breath improved.  No further dizziness or  chest pain.  Hemoglobin improved to 8.3.  No other specific questions or concerns at this time.  Currently denies headache, no visual changes, no chest pain, palpitations, no shortness of breath, no abdominal pain, no weakness, no fatigue, no paresthesias.  No acute events overnight per nursing staff.  Objective: Vitals:   06/17/21 0030 06/17/21 0203 06/17/21 0614 06/17/21 0756  BP: (!) 157/91 137/78 138/80 (!) 146/106  Pulse: 83 92 94 98  Resp: 16 18 18    Temp: 98.8 F (37.1 C) 99.1 F (37.3 C) 98.9 F (37.2 C)   TempSrc: Oral Oral Oral   SpO2: 100% 96% 99%   Weight:      Height:        Intake/Output Summary (Last 24 hours) at 06/17/2021 1200 Last data filed at 06/17/2021 1000 Gross per 24 hour  Intake 1440.26 ml  Output 300 ml  Net 1140.26 ml   Filed Weights   06/16/21 1456  Weight: 72.6 kg    Examination:  General exam: Appears calm and comfortable  Respiratory system: Clear to auscultation. Respiratory effort normal.  On room air Cardiovascular system: S1 & S2 heard, RRR. No JVD, murmurs, rubs, gallops or clicks. No pedal edema. Gastrointestinal system: Abdomen is nondistended, soft and nontender. No organomegaly or masses felt. Normal bowel sounds heard. Central nervous system: Alert and oriented. No focal neurological deficits. Extremities: Symmetric 5 x 5 power. Skin: No rashes, lesions or ulcers Psychiatry: Judgement and insight appear normal. Mood & affect appropriate.     Data Reviewed: I have personally reviewed following labs and imaging studies  CBC: Recent Labs  Lab 06/16/21 1549 06/17/21 0427  WBC 6.0 7.2  NEUTROABS 4.5 4.8  HGB 7.7* 8.3*  HCT 27.8* 29.0*  MCV 74.7* 76.7*  PLT 453* 420*   Basic Metabolic Panel: Recent Labs  Lab 06/16/21 1549 06/17/21 0429  NA 136 135  K 3.3* 3.0*  CL 104 103  CO2 21* 22  GLUCOSE 108* 82  BUN 6 8  CREATININE 0.59 0.53  CALCIUM 8.9 8.6*  MG 1.9  --    GFR: Estimated Creatinine Clearance: 91.1  mL/min (by C-G formula based on SCr of 0.53 mg/dL). Liver Function Tests: Recent Labs  Lab 06/16/21 1549 06/17/21 0429  AST 25 24  ALT 16 14  ALKPHOS 93 85  BILITOT 0.7 0.3  PROT 7.3 6.8  ALBUMIN 4.0 3.7   Recent Labs  Lab 06/16/21 1549  LIPASE 25   No results for input(s): AMMONIA in the last 168 hours. Coagulation Profile: No results for input(s): INR, PROTIME in the last 168 hours. Cardiac Enzymes: No results for input(s): CKTOTAL, CKMB, CKMBINDEX, TROPONINI in the last 168 hours. BNP (last 3 results) No results for input(s): PROBNP in the last 8760 hours. HbA1C: No results for input(s): HGBA1C in the last 72 hours. CBG: No results for input(s): GLUCAP in the last 168 hours. Lipid Profile: No results for input(s): CHOL, HDL, LDLCALC, TRIG, CHOLHDL, LDLDIRECT in the last 72 hours. Thyroid Function Tests: Recent Labs    06/16/21 2027  TSH 1.429   Anemia Panel: Recent Labs    06/16/21 2027  VITAMINB12 374  FOLATE 12.7  FERRITIN 430*  TIBC 424  IRON 385*  RETICCTPCT 1.8  Sepsis Labs: No results for input(s): PROCALCITON, LATICACIDVEN in the last 168 hours.  Recent Results (from the past 240 hour(s))  Resp Panel by RT-PCR (Flu A&B, Covid) Nasopharyngeal Swab     Status: None   Collection Time: 06/16/21  9:02 PM   Specimen: Nasopharyngeal Swab; Nasopharyngeal(NP) swabs in vial transport medium  Result Value Ref Range Status   SARS Coronavirus 2 by RT PCR NEGATIVE NEGATIVE Final    Comment: (NOTE) SARS-CoV-2 target nucleic acids are NOT DETECTED.  The SARS-CoV-2 RNA is generally detectable in upper respiratory specimens during the acute phase of infection. The lowest concentration of SARS-CoV-2 viral copies this assay can detect is 138 copies/mL. A negative result does not preclude SARS-Cov-2 infection and should not be used as the sole basis for treatment or other patient management decisions. A negative result may occur with  improper specimen  collection/handling, submission of specimen other than nasopharyngeal swab, presence of viral mutation(s) within the areas targeted by this assay, and inadequate number of viral copies(<138 copies/mL). A negative result must be combined with clinical observations, patient history, and epidemiological information. The expected result is Negative.  Fact Sheet for Patients:  BloggerCourse.com  Fact Sheet for Healthcare Providers:  SeriousBroker.it  This test is no t yet approved or cleared by the [place] FDA and  has been authorized for detection and/or diagnosis of SARS-CoV-2 by FDA under an Emergency Use Authorization (EUA). This EUA will remain  in effect (meaning this test can be used) for the duration of the COVID-19 declaration under Section 564(b)(1) of the Act, 21 U.S.C.section 360bbb-3(b)(1), unless the authorization is terminated  or revoked sooner.       Influenza A by PCR NEGATIVE NEGATIVE Final   Influenza B by PCR NEGATIVE NEGATIVE Final    Comment: (NOTE) The Xpert Xpress SARS-CoV-2/FLU/RSV plus assay is intended as an aid in the diagnosis of influenza from Nasopharyngeal swab specimens and should not be used as a sole basis for treatment. Nasal washings and aspirates are unacceptable for Xpert Xpress SARS-CoV-2/FLU/RSV testing.  Fact Sheet for Patients: BloggerCourse.com  Fact Sheet for Healthcare Providers: SeriousBroker.it  This test is not yet approved or cleared by the [place] FDA and has been authorized for detection and/or diagnosis of SARS-CoV-2 by FDA under an Emergency Use Authorization (EUA). This EUA will remain in effect (meaning this test can be used) for the duration of the COVID-19 declaration under Section 564(b)(1) of the Act, 21 U.S.C. section 360bbb-3(b)(1), unless the authorization is terminated or revoked.  Performed at [facility], [address], [place], [place] [address]          Radiology Studies: DG Chest 2 View  Result Date: 06/16/2021 CLINICAL DATA:  Chest pain, fever, cough EXAM: CHEST - 2 VIEW COMPARISON:  None. FINDINGS: The heart size and mediastinal contours are within normal limits. Both lungs are clear. The visualized skeletal structures are unremarkable. IMPRESSION: No active cardiopulmonary disease. Electronically Signed   By: Ernie Avena M.D.   On: 06/16/2021 15:46   CT HEAD WO CONTRAST ( )  Result Date: 06/16/2021 CLINICAL DATA:  Chronic headache. EXAM: CT HEAD WITHOUT CONTRAST TECHNIQUE: Contiguous axial images were obtained from the base of the skull through the vertex without intravenous contrast. COMPARISON:  None. FINDINGS: Brain: No evidence of acute infarction, hemorrhage, hydrocephalus, extra-axial collection or mass lesion/mass effect. Vascular: No hyperdense vessel or unexpected calcification. Skull: Normal. Negative for fracture or focal lesion. Sinuses/Orbits: Globes and orbits are unremarkable. Visualized  sinuses are clear. Other: None. IMPRESSION: Normal unenhanced CT scan of the brain. Electronically Signed   By: Amie Portland M.D.   On: 06/16/2021 15:31        Scheduled Meds:  amLODipine  5 mg Oral Daily   folic acid  1 mg Oral Daily   multivitamin with minerals  1 tablet Oral Daily   pantoprazole (PROTONIX) IV  40 mg Intravenous Q12H   thiamine  100 mg Oral Daily   Or   thiamine  100 mg Intravenous Daily   Continuous Infusions:  sodium chloride       LOS: 1 day    Time spent: 41 minutes spent on chart review, discussion with nursing staff, consultants, updating family and interview/physical exam; more than 50% of that time was spent in counseling and/or coordination of care.    Alvira Philips [place], DO [facility] Available via Epic secure chat 7am-7pm After these hours, please refer to coverage provider listed on  [email or web address] 06/17/2021, 12:00 PM

## 2021-06-17 NOTE — H&P (View-Only) (Signed)
[facility] Consultation Note  Referring Provider: [facility] Primary Care Physician:  Murlean Caller, MD  Reason for Consultation:  hematochezia, anemia  HPI: Cheryl Daniels is a 40 y.o. female whom we've been asked to see for above reasons.  Went to ED for weakness and found to have anemia; FOBT positive; prior history of anemia x 2+ years after a miscarriage, with hematology evaluation for IV iron without resolution of anemia.  History of intermittent mild hematochezia x 1+ years.  No abdominal pain, change in bowel habits, weight loss, prior endoscopy, prior colonoscopy, blood thinners.  Takes NSAIDs periodically.  Family history of colon cancer (father).   Past Medical History:  Diagnosis Date   Asthma    Back injuries    Hypertension     Past Surgical History:  Procedure Laterality Date   NO PAST SURGERIES      Prior to Admission medications   Medication Sig Start Date End Date Taking? Authorizing Provider  Aspirin-Salicylamide-Caffeine (BC HEADACHE PO) Take 1 packet by mouth daily as needed (headache and back pain).   Yes [provider]    Current Facility-Administered Medications  Medication Dose Route Frequency Provider Last Rate Last Admin   0.9 %  sodium chloride infusion  10 mL/hr Intravenous Once Solon Augusta S, PA       acetaminophen (TYLENOL) tablet 650 mg  650 mg Oral Q6H PRN Rolly Salter, MD   650 mg at 06/17/21 0932   Or   acetaminophen (TYLENOL) suppository 650 mg  650 mg Rectal Q6H PRN Rolly Salter, MD       [START ON 06/18/2021] amLODipine (NORVASC) tablet 10 mg  10 mg Oral Daily [place], Eric J, DO       folic acid (FOLVITE) tablet 1 mg  1 mg Oral Daily Rolly Salter, MD   1 mg at 06/17/21 6712   hydrALAZINE (APRESOLINE) tablet 25 mg  25 mg Oral Q6H PRN [place], Eric J, DO       LORazepam (ATIVAN) tablet 1-4 mg  1-4 mg Oral Q1H PRN Rolly Salter, MD       Or   LORazepam (ATIVAN) injection 1-4 mg  1-4 mg Intravenous  Q1H PRN Rolly Salter, MD       multivitamin with minerals tablet 1 tablet  1 tablet Oral Daily Rolly Salter, MD   1 tablet at 06/17/21 0924   ondansetron (ZOFRAN) tablet 4 mg  4 mg Oral Q6H PRN Rolly Salter, MD       Or   ondansetron [facility]) injection 4 mg  4 mg Intravenous Q6H PRN Rolly Salter, MD       pantoprazole (PROTONIX) injection 40 mg  40 mg Intravenous Q12H Rolly Salter, MD   40 mg at 06/17/21 4580   potassium chloride (KLOR-CON) CR tablet 30 mEq  30 mEq Oral Q4H [place], Eric J, DO       thiamine tablet 100 mg  100 mg Oral Daily Rolly Salter, MD   100 mg at 06/17/21 9983   Or   thiamine (B-1) injection 100 mg  100 mg Intravenous Daily Rolly Salter, MD        Allergies as of 06/16/2021   (No Known Allergies)    Family History  Problem Relation Age of Onset   Hypertension Father     Social History   Socioeconomic History   Marital status: Single    Spouse name: Not on file  Number of children: Not on file   Years of education: Not on file   Highest education level: Not on file  Occupational History   Not on file  Tobacco Use   Smoking status: Former    Packs/day: 0.50    Years: 10.00    Pack years: 5.00    Types: Cigarettes    Quit date: 06/06/2014    Years since quitting: 7.0   Smokeless tobacco: Never  Vaping Use   Vaping Use: Never used  Substance and Sexual Activity   Alcohol use: No   Drug use: Not Currently    Types: Marijuana    Comment: daily prior to pregnancy   Sexual activity: Yes    Birth control/protection: None  Other Topics Concern   Not on file  Social History Narrative   Not on file   Social Determinants of Health   Financial Resource Strain: Not on file  Food Insecurity: Not on file  Transportation Needs: Not on file  Physical Activity: Not on file  Stress: Not on file  Social Connections: Not on file  Intimate Partner Violence: Not on file    Review of Systems: As per HPI, all others  negative  Physical Exam: Vital signs in last 24 hours: Temp:  [98.2 F (36.8 C)-99.1 F (37.3 C)] 98.9 F (37.2 C) (11/26 0614) Pulse Rate:  [83-117] 98 (11/26 0756) Resp:  [11-20] 18 (11/26 0614) BP: (137-163)/(78-114) 146/106 (11/26 0756) SpO2:  [96 %-100 %] 99 % (11/26 0349) Weight:  [72.6 kg] 72.6 kg (11/25 1456) Last BM Date: 06/14/21 General:   Alert,  Well-developed, well-nourished, pleasant and cooperative in NAD Head:  Normocephalic and atraumatic. Eyes:  Sclera clear, no icterus.   Conjunctiva pale Ears:  Normal auditory acuity. Nose:  No deformity, discharge,  or lesions. Mouth:  No deformity or lesions.  Oropharynx pale Neck:  Supple; no masses or thyromegaly. Heart:  Regular  Abdomen:  Soft, protuberant, nontender and nondistended. No masses, hepatosplenomegaly or hernias noted. Normal bowel sounds, without guarding, and without rebound.     Msk:  Symmetrical without gross deformities. Normal posture. Pulses:  Normal pulses noted. Extremities:  Without clubbing or edema. Neurologic:  Alert and  oriented x4;  grossly normal neurologically. Skin:  Intact without significant lesions or rashes. Psych:  Alert and cooperative. Normal mood and affect.   Lab Results: Recent Labs    06/16/21 1549 06/17/21 0427  WBC 6.0 7.2  HGB 7.7* 8.3*  HCT 27.8* 29.0*  PLT 453* 420*   BMET Recent Labs    06/16/21 1549 06/17/21 0429  NA 136 135  K 3.3* 3.0*  CL 104 103  CO2 21* 22  GLUCOSE 108* 82  BUN 6 8  CREATININE 0.59 0.53  CALCIUM 8.9 8.6*   LFT Recent Labs    06/17/21 0429  PROT 6.8  ALBUMIN 3.7  AST 24  ALT 14  ALKPHOS 85  BILITOT 0.3   PT/INR No results for input(s): LABPROT, INR in the last 72 hours.  Studies/Results: DG Chest 2 View  Result Date: 06/16/2021 CLINICAL DATA:  Chest pain, fever, cough EXAM: CHEST - 2 VIEW COMPARISON:  None. FINDINGS: The heart size and mediastinal contours are within normal limits. Both lungs are clear. The  visualized skeletal structures are unremarkable. IMPRESSION: No active cardiopulmonary disease. Electronically Signed   By: Ernie Avena M.D.   On: 06/16/2021 15:46   CT HEAD WO CONTRAST ( )  Result Date: 06/16/2021 CLINICAL DATA:  Chronic headache. EXAM:  CT HEAD WITHOUT CONTRAST TECHNIQUE: Contiguous axial images were obtained from the base of the skull through the vertex without intravenous contrast. COMPARISON:  None. FINDINGS: Brain: No evidence of acute infarction, hemorrhage, hydrocephalus, extra-axial collection or mass lesion/mass effect. Vascular: No hyperdense vessel or unexpected calcification. Skull: Normal. Negative for fracture or focal lesion. Sinuses/Orbits: Globes and orbits are unremarkable. Visualized sinuses are clear. Other: None. IMPRESSION: Normal unenhanced CT scan of the brain. Electronically Signed   By: Amie Portland M.D.   On: 06/16/2021 15:31    Impression:   Hematochezia, mild, intermittent x 1+ years.  Microcytic anemia.  History of recurrent iron infusions.  Family history of colon cancer (father).  Plan:   Clear liquid diet, NPO after midnight.  Endoscopy and colonoscopy tomorrow. Risks (bleeding, infection, bowel perforation that could require surgery, sedation-related changes in cardiopulmonary systems), benefits (identification and possible treatment of source of symptoms, exclusion of certain causes of symptoms), and alternatives (watchful waiting, radiographic imaging studies, empiric medical treatment) of upper endoscopy (EGD) were explained to patient/family in detail and patient wishes to proceed. Risks (bleeding, infection, bowel perforation that could require surgery, sedation-related changes in cardiopulmonary systems), benefits (identification and possible treatment of source of symptoms, exclusion of certain causes of symptoms), and alternatives (watchful waiting, radiographic imaging studies, empiric medical treatment) of colonoscopy were  explained to patient/family in detail and patient wishes to proceed.  Next step in management pending endoscopy/colonoscopy findings. [facility] will follow.   LOS: 1 day   Adir Schicker M  06/17/2021, 12:44 PM  Cell [phone] If no answer or after 5 PM call [phone]

## 2021-06-17 NOTE — Consult Note (Signed)
[facility] Consultation Note  Referring Provider: [facility] Primary Care Physician:  Murlean Caller, MD  Reason for Consultation:  hematochezia, anemia  HPI: Cheryl Daniels is a 40 y.o. female whom we've been asked to see for above reasons.  Went to ED for weakness and found to have anemia; FOBT positive; prior history of anemia x 2+ years after a miscarriage, with hematology evaluation for IV iron without resolution of anemia.  History of intermittent mild hematochezia x 1+ years.  No abdominal pain, change in bowel habits, weight loss, prior endoscopy, prior colonoscopy, blood thinners.  Takes NSAIDs periodically.  Family history of colon cancer (father).   Past Medical History:  Diagnosis Date   Asthma    Back injuries    Hypertension     Past Surgical History:  Procedure Laterality Date   NO PAST SURGERIES      Prior to Admission medications   Medication Sig Start Date End Date Taking? Authorizing Provider  Aspirin-Salicylamide-Caffeine (BC HEADACHE PO) Take 1 packet by mouth daily as needed (headache and back pain).   Yes [provider]    Current Facility-Administered Medications  Medication Dose Route Frequency Provider Last Rate Last Admin   0.9 %  sodium chloride infusion  10 mL/hr Intravenous Once Solon Augusta S, PA       acetaminophen (TYLENOL) tablet 650 mg  650 mg Oral Q6H PRN Rolly Salter, MD   650 mg at 06/17/21 0932   Or   acetaminophen (TYLENOL) suppository 650 mg  650 mg Rectal Q6H PRN Rolly Salter, MD       [START ON 06/18/2021] amLODipine (NORVASC) tablet 10 mg  10 mg Oral Daily [place], Eric J, DO       folic acid (FOLVITE) tablet 1 mg  1 mg Oral Daily Rolly Salter, MD   1 mg at 06/17/21 6712   hydrALAZINE (APRESOLINE) tablet 25 mg  25 mg Oral Q6H PRN [place], Eric J, DO       LORazepam (ATIVAN) tablet 1-4 mg  1-4 mg Oral Q1H PRN Rolly Salter, MD       Or   LORazepam (ATIVAN) injection 1-4 mg  1-4 mg Intravenous  Q1H PRN Rolly Salter, MD       multivitamin with minerals tablet 1 tablet  1 tablet Oral Daily Rolly Salter, MD   1 tablet at 06/17/21 0924   ondansetron (ZOFRAN) tablet 4 mg  4 mg Oral Q6H PRN Rolly Salter, MD       Or   ondansetron [facility]) injection 4 mg  4 mg Intravenous Q6H PRN Rolly Salter, MD       pantoprazole (PROTONIX) injection 40 mg  40 mg Intravenous Q12H Rolly Salter, MD   40 mg at 06/17/21 4580   potassium chloride (KLOR-CON) CR tablet 30 mEq  30 mEq Oral Q4H [place], Eric J, DO       thiamine tablet 100 mg  100 mg Oral Daily Rolly Salter, MD   100 mg at 06/17/21 9983   Or   thiamine (B-1) injection 100 mg  100 mg Intravenous Daily Rolly Salter, MD        Allergies as of 06/16/2021   (No Known Allergies)    Family History  Problem Relation Age of Onset   Hypertension Father     Social History   Socioeconomic History   Marital status: Single    Spouse name: Not on file  Number of children: Not on file   Years of education: Not on file   Highest education level: Not on file  Occupational History   Not on file  Tobacco Use   Smoking status: Former    Packs/day: 0.50    Years: 10.00    Pack years: 5.00    Types: Cigarettes    Quit date: 06/06/2014    Years since quitting: 7.0   Smokeless tobacco: Never  Vaping Use   Vaping Use: Never used  Substance and Sexual Activity   Alcohol use: No   Drug use: Not Currently    Types: Marijuana    Comment: daily prior to pregnancy   Sexual activity: Yes    Birth control/protection: None  Other Topics Concern   Not on file  Social History Narrative   Not on file   Social Determinants of Health   Financial Resource Strain: Not on file  Food Insecurity: Not on file  Transportation Needs: Not on file  Physical Activity: Not on file  Stress: Not on file  Social Connections: Not on file  Intimate Partner Violence: Not on file    Review of Systems: As per HPI, all others  negative  Physical Exam: Vital signs in last 24 hours: Temp:  [98.2 F (36.8 C)-99.1 F (37.3 C)] 98.9 F (37.2 C) (11/26 0614) Pulse Rate:  [83-117] 98 (11/26 0756) Resp:  [11-20] 18 (11/26 0614) BP: (137-163)/(78-114) 146/106 (11/26 0756) SpO2:  [96 %-100 %] 99 % (11/26 0614) Weight:  [72.6 kg] 72.6 kg (11/25 1456) Last BM Date: 06/14/21 General:   Alert,  Well-developed, well-nourished, pleasant and cooperative in NAD Head:  Normocephalic and atraumatic. Eyes:  Sclera clear, no icterus.   Conjunctiva pale Ears:  Normal auditory acuity. Nose:  No deformity, discharge,  or lesions. Mouth:  No deformity or lesions.  Oropharynx pale Neck:  Supple; no masses or thyromegaly. Heart:  Regular  Abdomen:  Soft, protuberant, nontender and nondistended. No masses, hepatosplenomegaly or hernias noted. Normal bowel sounds, without guarding, and without rebound.     Msk:  Symmetrical without gross deformities. Normal posture. Pulses:  Normal pulses noted. Extremities:  Without clubbing or edema. Neurologic:  Alert and  oriented x4;  grossly normal neurologically. Skin:  Intact without significant lesions or rashes. Psych:  Alert and cooperative. Normal mood and affect.   Lab Results: Recent Labs    06/16/21 1549 06/17/21 0427  WBC 6.0 7.2  HGB 7.7* 8.3*  HCT 27.8* 29.0*  PLT 453* 420*   BMET Recent Labs    06/16/21 1549 06/17/21 0429  NA 136 135  K 3.3* 3.0*  CL 104 103  CO2 21* 22  GLUCOSE 108* 82  BUN 6 8  CREATININE 0.59 0.53  CALCIUM 8.9 8.6*   LFT Recent Labs    06/17/21 0429  PROT 6.8  ALBUMIN 3.7  AST 24  ALT 14  ALKPHOS 85  BILITOT 0.3   PT/INR No results for input(s): LABPROT, INR in the last 72 hours.  Studies/Results: DG Chest 2 View  Result Date: 06/16/2021 CLINICAL DATA:  Chest pain, fever, cough EXAM: CHEST - 2 VIEW COMPARISON:  None. FINDINGS: The heart size and mediastinal contours are within normal limits. Both lungs are clear. The  visualized skeletal structures are unremarkable. IMPRESSION: No active cardiopulmonary disease. Electronically Signed   By: Palani  Rathinasamy M.D.   On: 06/16/2021 15:46   CT HEAD WO CONTRAST (5MM)  Result Date: 06/16/2021 CLINICAL DATA:  Chronic headache. EXAM:   CT HEAD WITHOUT CONTRAST TECHNIQUE: Contiguous axial images were obtained from the base of the skull through the vertex without intravenous contrast. COMPARISON:  None. FINDINGS: Brain: No evidence of acute infarction, hemorrhage, hydrocephalus, extra-axial collection or mass lesion/mass effect. Vascular: No hyperdense vessel or unexpected calcification. Skull: Normal. Negative for fracture or focal lesion. Sinuses/Orbits: Globes and orbits are unremarkable. Visualized sinuses are clear. Other: None. IMPRESSION: Normal unenhanced CT scan of the brain. Electronically Signed   By: David  Ormond M.D.   On: 06/16/2021 15:31    Impression:   Hematochezia, mild, intermittent x 1+ years.  Microcytic anemia.  History of recurrent iron infusions.  Family history of colon cancer (father).  Plan:   Clear liquid diet, NPO after midnight.  Endoscopy and colonoscopy tomorrow. Risks (bleeding, infection, bowel perforation that could require surgery, sedation-related changes in cardiopulmonary systems), benefits (identification and possible treatment of source of symptoms, exclusion of certain causes of symptoms), and alternatives (watchful waiting, radiographic imaging studies, empiric medical treatment) of upper endoscopy (EGD) were explained to patient/family in detail and patient wishes to proceed. Risks (bleeding, infection, bowel perforation that could require surgery, sedation-related changes in cardiopulmonary systems), benefits (identification and possible treatment of source of symptoms, exclusion of certain causes of symptoms), and alternatives (watchful waiting, radiographic imaging studies, empiric medical treatment) of colonoscopy were  explained to patient/family in detail and patient wishes to proceed.  Next step in management pending endoscopy/colonoscopy findings. [facility] will follow.   LOS: 1 day   Obediah Welles M  06/17/2021, 12:44 PM  Cell [phone] If no answer or after 5 PM call [phone]  

## 2021-06-18 ENCOUNTER — Encounter (HOSPITAL_COMMUNITY)
Admission: EM | Disposition: A | Discharge status: Home / Self Care | Payer: Self-pay | Source: Home / Self Care | Attending: Internal Medicine

## 2021-06-18 ENCOUNTER — Encounter (HOSPITAL_COMMUNITY): Payer: Self-pay | Admitting: Internal Medicine

## 2021-06-18 ENCOUNTER — Inpatient Hospital Stay (HOSPITAL_COMMUNITY): Payer: Medicaid Other | Admitting: Certified Registered Nurse Anesthetist

## 2021-06-18 DIAGNOSIS — D649 Anemia, unspecified: Secondary | ICD-10-CM | POA: Diagnosis not present

## 2021-06-18 HISTORY — PX: COLONOSCOPY WITH PROPOFOL: SHX5780

## 2021-06-18 HISTORY — PX: ESOPHAGOGASTRODUODENOSCOPY (EGD) WITH PROPOFOL: SHX5813

## 2021-06-18 LAB — BASIC METABOLIC PANEL
Anion gap: 4 — ABNORMAL LOW (ref 5–15)
BUN: 6 mg/dL (ref 6–20)
CO2: 20 mmol/L — ABNORMAL LOW (ref 22–32)
Calcium: 8.5 mg/dL — ABNORMAL LOW (ref 8.9–10.3)
Chloride: 114 mmol/L — ABNORMAL HIGH (ref 98–111)
Creatinine, Ser: 0.55 mg/dL (ref 0.44–1.00)
GFR, Estimated: >60 mL/min (ref >60)
Glucose, Bld: 85 mg/dL (ref 70–99)
Potassium: 4 mmol/L (ref 3.5–5.1)
Sodium: 138 mmol/L (ref 135–145)

## 2021-06-18 LAB — CBC WITH DIFFERENTIAL/PLATELET
Abs Immature Granulocytes: 0.08 10*3/uL — ABNORMAL HIGH (ref 0.00–0.07)
Basophils Absolute: 0.1 10*3/uL (ref 0.0–0.1)
Basophils Relative: 1 %
Eosinophils Absolute: 0.3 10*3/uL (ref 0.0–0.5)
Eosinophils Relative: 4 %
HCT: 29.3 % — ABNORMAL LOW (ref 36.0–46.0)
Hemoglobin: 8.4 g/dL — ABNORMAL LOW (ref 12.0–15.0)
Immature Granulocytes: 1 %
Lymphocytes Relative: 26 %
Lymphs Abs: 1.9 10*3/uL (ref 0.7–4.0)
MCH: 22.3 pg — ABNORMAL LOW (ref 26.0–34.0)
MCHC: 28.7 g/dL — ABNORMAL LOW (ref 30.0–36.0)
MCV: 77.7 fL — ABNORMAL LOW (ref 80.0–100.0)
Monocytes Absolute: 0.6 10*3/uL (ref 0.1–1.0)
Monocytes Relative: 8 %
Neutro Abs: 4.6 10*3/uL (ref 1.7–7.7)
Neutrophils Relative %: 60 %
Platelets: 428 10*3/uL — ABNORMAL HIGH (ref 150–400)
RBC: 3.77 MIL/uL — ABNORMAL LOW (ref 3.87–5.11)
RDW: 21 % — ABNORMAL HIGH (ref 11.5–15.5)
WBC: 7.5 10*3/uL (ref 4.0–10.5)
nRBC: 1.2 % — ABNORMAL HIGH (ref 0.0–0.2)

## 2021-06-18 LAB — MAGNESIUM: Magnesium: 2 mg/dL (ref 1.7–2.4)

## 2021-06-18 SURGERY — ESOPHAGOGASTRODUODENOSCOPY (EGD) WITH PROPOFOL
Anesthesia: Monitor Anesthesia Care

## 2021-06-18 MED ORDER — PROPOFOL 500 MG/50ML IV EMUL
INTRAVENOUS | Status: DC | PRN
Start: 2021-06-18 — End: 2021-06-18
  Administered 2021-06-18: 125 ug/kg/min via INTRAVENOUS

## 2021-06-18 MED ORDER — PROPOFOL 10 MG/ML IV BOLUS
INTRAVENOUS | Status: DC | PRN
  Administered 2021-06-18: 40 mg via INTRAVENOUS
  Administered 2021-06-18: 50 mg via INTRAVENOUS
  Administered 2021-06-18: 30 mg via INTRAVENOUS

## 2021-06-18 MED ORDER — PROPOFOL 1000 MG/100ML IV EMUL
INTRAVENOUS | Status: AC
  Filled 2021-06-18: qty 100

## 2021-06-18 MED ORDER — PROPOFOL 10 MG/ML IV BOLUS
INTRAVENOUS | Status: AC
  Filled 2021-06-18: qty 20

## 2021-06-18 MED ORDER — ONDANSETRON HCL 4 MG/2ML IJ SOLN
INTRAMUSCULAR | Status: DC | PRN
  Administered 2021-06-18: 4 mg via INTRAVENOUS

## 2021-06-18 MED ORDER — LACTATED RINGERS IV SOLN
INTRAVENOUS | Status: AC | PRN
  Administered 2021-06-18: 1000 mL via INTRAVENOUS

## 2021-06-18 MED ORDER — LIDOCAINE HCL (CARDIAC) PF 100 MG/5ML IV SOSY
PREFILLED_SYRINGE | INTRAVENOUS | Status: DC | PRN
  Administered 2021-06-18: 60 mg via INTRATRACHEAL

## 2021-06-18 MED ORDER — PANTOPRAZOLE SODIUM 40 MG PO TBEC
40.0000 mg | DELAYED_RELEASE_TABLET | Freq: Every day | ORAL | Status: DC
  Administered 2021-06-19: 10:00:00 40 mg via ORAL
  Filled 2021-06-18: qty 1

## 2021-06-18 SURGICAL SUPPLY — 25 items
BLOCK BITE 60FR ADLT L/F BLUE (MISCELLANEOUS) ×4 IMPLANT
ELECT REM PT RETURN 9FT ADLT (ELECTROSURGICAL)
ELECTRODE REM PT RTRN 9FT ADLT (ELECTROSURGICAL) IMPLANT
FCP BXJMBJMB 240X2.8X (CUTTING FORCEPS)
FLOOR PAD 36X40 (MISCELLANEOUS) ×4
FORCEP RJ3 GP 1.8X160 W-NEEDLE (CUTTING FORCEPS) IMPLANT
FORCEPS BIOP RAD 4 LRG CAP 4 (CUTTING FORCEPS) IMPLANT
FORCEPS BIOP RJ4 240 W/NDL (CUTTING FORCEPS)
FORCEPS BXJMBJMB 240X2.8X (CUTTING FORCEPS) IMPLANT
INJECTOR/SNARE I SNARE (MISCELLANEOUS) IMPLANT
LUBRICANT JELLY 4.5OZ STERILE (MISCELLANEOUS) IMPLANT
MANIFOLD NEPTUNE II (INSTRUMENTS) IMPLANT
NDL SCLEROTHERAPY 25GX240 (NEEDLE) IMPLANT
NEEDLE SCLEROTHERAPY 25GX240 (NEEDLE) IMPLANT
PAD FLOOR 36X40 (MISCELLANEOUS) ×2 IMPLANT
PROBE APC STR FIRE (PROBE) IMPLANT
PROBE INJECTION GOLD (MISCELLANEOUS)
PROBE INJECTION GOLD 7FR (MISCELLANEOUS) IMPLANT
SNARE ROTATE MED OVAL 20MM (MISCELLANEOUS) IMPLANT
SNARE SHORT THROW 13M SML OVAL (MISCELLANEOUS) IMPLANT
SYR 50ML LL SCALE MARK (SYRINGE) IMPLANT
TRAP SPECIMEN MUCOUS 40CC (MISCELLANEOUS) IMPLANT
TUBING ENDO SMARTCAP PENTAX (MISCELLANEOUS) ×8 IMPLANT
TUBING IRRIGATION ENDOGATOR (MISCELLANEOUS) ×8 IMPLANT
WATER STERILE IRR 1000ML POUR (IV SOLUTION) IMPLANT

## 2021-06-18 NOTE — Op Note (Signed)
[facility] Patient Name: Cheryl Daniels Procedure Date: 06/18/2021 MRN: [id number] Attending MD: Willis Modena , MD Date of Birth: [birth year] CSN: [id number] Age: 40 Admit Type: Inpatient Procedure:                Upper GI endoscopy Indications:              Iron deficiency anemia Providers:                Willis Modena, MD, Janae Sauce. Steele Berg, RN, Rozetta Nunnery, Technician Referring MD:             [facility] Medicines:                Monitored Anesthesia Care Complications:            No immediate complications. Estimated Blood Loss:     Estimated blood loss: none. Procedure:                Pre-Anesthesia Assessment:                           - Prior to the procedure, a History and Physical                            was performed, and patient medications and                            allergies were reviewed. The patient's tolerance of                            previous anesthesia was also reviewed. The risks                            and benefits of the procedure and the sedation                            options and risks were discussed with the patient.                            All questions were answered, and informed consent                            was obtained. Prior Anticoagulants: The patient has                            taken no previous anticoagulant or antiplatelet                            agents. ASA Grade Assessment: III - A patient with                            severe systemic disease. After reviewing the risks  and benefits, the patient was deemed in                            satisfactory condition to undergo the procedure.                           After obtaining informed consent, the endoscope was                            passed under direct vision. Throughout the                            procedure, the patient's blood pressure, pulse, and                             oxygen saturations were monitored continuously. The                            [id number]-[id number] ([id number] Olympus endoscope was introduced                            through the mouth, and advanced to the second part                            of duodenum. The upper GI endoscopy was                            accomplished without difficulty. The patient                            tolerated the procedure well. Scope In: Scope Out: Findings:      A small hiatal hernia was present.      LA Grade A (one or more mucosal breaks less than 5 mm, not extending       between tops of 2 mucosal folds) esophagitis was found.      The exam of the esophagus was otherwise normal.      The entire examined stomach was normal.      The duodenal bulb, first portion of the duodenum and second portion of       the duodenum were normal.      No old or fresh blood was seen to the extent of our examination. Impression:               - Small hiatal hernia.                           - LA Grade A esophagitis.                           - Normal stomach.                           - Normal duodenal bulb, first portion of the                            duodenum  and second portion of the duodenum.                           - No specimens collected. Moderate Sedation:      None Recommendation:           - Perform a colonoscopy today. Procedure Code(s):        --- Professional ---                           [phone], Esophagogastroduodenoscopy, flexible,                            transoral; diagnostic, including collection of                            specimen(s) by brushing or washing, when performed                            (separate procedure) Diagnosis Code(s):        --- Professional ---                           K44.9, Diaphragmatic hernia without obstruction or                            gangrene                           K20.90, Esophagitis, unspecified without bleeding                           D50.9, Iron  deficiency anemia, unspecified CPT copyright 2019 [employer]. All rights reserved. The codes documented in this report are preliminary and upon coder review may  be revised to meet current compliance requirements. Willis Modena, MD 06/18/2021 10:56:34 AM This report has been signed electronically. Number of Addenda: 0

## 2021-06-18 NOTE — Anesthesia Preprocedure Evaluation (Addendum)
Anesthesia Evaluation  Patient identified by MRN, date of birth, ID band Patient awake    Reviewed: Allergy & Precautions, NPO status , Patient's Chart, lab work & pertinent test results  Airway Mallampati: II       Dental   Pulmonary asthma , former smoker,    breath sounds clear to auscultation       Cardiovascular hypertension,  Rhythm:Regular Rate:Normal     Neuro/Psych  Headaches,    GI/Hepatic Neg liver ROS, GERD  ,History noted Dr. Nyoka Cowden   Endo/Other  negative endocrine ROS  Renal/GU negative Renal ROS     Musculoskeletal   Abdominal   Peds  Hematology   Anesthesia Other Findings   Reproductive/Obstetrics                            Anesthesia Physical Anesthesia Plan  ASA: 3  Anesthesia Plan: MAC   Post-op Pain Management:    Induction:   PONV Risk Score and Plan: 2 and Ondansetron  Airway Management Planned: Nasal Cannula and Simple Face Mask  Additional Equipment:   Intra-op Plan:   Post-operative Plan:   Informed Consent: I have reviewed the patients History and Physical, chart, labs and discussed the procedure including the risks, benefits and alternatives for the proposed anesthesia with the patient or authorized representative who has indicated his/her understanding and acceptance.     Dental advisory given  Plan Discussed with: Anesthesiologist  Anesthesia Plan Comments:       Anesthesia Quick Evaluation

## 2021-06-18 NOTE — Progress Notes (Signed)
PROGRESS NOTE    Cheryl Daniels  AOZ:[id number] DOB: Oct 12, 1980 DOA: 06/16/2021 PCP: Murlean Caller, MD    Brief Narrative:  Cheryl Daniels is a 40 year old female with past medical history significant for essential hypertension, chronic iron deficiency anemia, chronic headache, history of hemorrhoids, NSAID abuse, alcohol use, tobacco use disorder who presents to [facility] ED on 11/25 with complaint of dizziness, fatigue, shortness of breath and intermittent chest pain.  Onset 2 weeks ago with progressive worsening.  Also reports chronic frontal headache in which she takes Palmer powder frequently.  Also endorses EtOH use daily with 16 ounces of wine.  Also reports blood in her stool usually daily.  In the ED, temperature 98.2 F, HR 117, RR 18, BP 162/112, SPO2 100% on room air.WBC 6.0, hemoglobin 7.7, platelets 453.  Sodium 136, potassium 3.3, chloride 104, CO2 21, glucose 108, BUN 6, creatinine 0.59.  AST 25, ALT 25.  Lipase 25, total bilirubin 0.7.  High sensitive troponin <2, 4.  hCG negative.  TSH 1.429.  Covid-19 PCR negative.  Influenza A/B PCR negative.  CT head without contrast with no acute intracranial abnormality.  EKG with sinus tachycardia, rate 103, QTc 451, no concerning dynamic changes.  Order for 1 unit PRBC by EDP.  TRH consulted for further evaluation and management of symptomatic anemia with concern for GI bleed given Goody powder use   Assessment & Plan:   Principal Problem:   Symptomatic anemia Active Problems:   Hypertensive urgency   Chest pain   Dizziness   Chronic diarrhea   Hemorrhoids   BRBPR (bright red blood per rectum)   Iron deficiency anemia due to chronic blood loss   Alcohol use   GERD (gastroesophageal reflux disease)   Chronic headache   long-term use of Goody's   Folate deficiency   Facial rash   Nausea & vomiting   Symptomatic anemia Hx IDA Patient presenting to the ED with dizziness, shortness of breath and intermittent chest pain.  History of  IDA, follows with [facility] hematology with last iron infusion on 06/14/2021.  Hemoglobin 7.7 on admission.  Anemia panel with iron 385, TIBC 424, ferritin 430, folate 12.7, B12 374.  FOBT positive.  Complicated by continuous use of Goody powder for frontal headache.  Concern for gastric ulcer.  Patient also reports history of hemorrhoids.  Denies heavy menses.  Gastroenterology, Dr. Dulce Sellar was consulted and patient underwent EGD/colonoscopy on 06/18/2021.  EGD with small hiatal hernia, LA grade a esophagitis, normal stomach/duodenal bulb, normal first/second portion of duodenum.  Colonoscopy notable for external/internal hemorrhoids, normal ileum and no other findings. --[facility] consulted; Dr. Dulce Sellar; consideration of endoscopy inpatient versus outpatient --Hgb 7.7>>8.3>8.4 following 1 unit PRBC 11/25 --Iron stores sufficient, no need for repeat iron infusion at this time --Protonix 40 p.o. daily --Discussed discontinuation of NSAIDs --H. pylori, IgG, IgA, IgM, tissue transglutaminase IgA: Pending --Repeat CBC in a.m.  Hypertensive urgency Patient with persistent elevated blood pressure, currently not on antihypertensives.  Discussed with patient likely need to reinitiate antihypertensive therapy given her persistent headaches which is likely related. --Increased Amlodipine to 10 mg p.o. daily --Hydralazine 25 mg p.o. q6h PRN SBP >160 or DBP >110  Hypokalemia Potassium 4.0 this morning.   --Repeat electrolytes in the a.m.  EtOH use disorder Patient reports daily alcohol use with 16 ounces of wine daily.  Denies history of withdrawal symptoms. --Counseled on need for cessation given her anemia --Multivitamin, thiamine, folic acid  Tobacco use disorder Reports quitting 7  years ago.  Continue tobacco cessation   DVT prophylaxis: SCDs Start: 06/16/21 2340   Code Status: Full Code Family Communication: no family present at bedside  Disposition Plan:  Level of care: Med-Surg Status  is: Inpatient  Remains inpatient appropriate because: Repeat CBC in a.m., if stable will discharge home with outpatient follow-up with GI/hematology  Consultants:  [facility] gastroenterology, Dr. Dulce Sellar  Procedures:  EGD/colonoscopy 11/27  Antimicrobials:  None   Subjective: Patient seen examined bedside, resting comfortably.  Just returned from EGD/colonoscopy with unremarkable findings.  GI plans further work-up with celiac/H. pylori testing.  No other questions or concerns at this time.  Currently denies headache, no visual changes, no chest pain, palpitations, no shortness of breath, no abdominal pain, no weakness, no fatigue, no paresthesias.  No acute events overnight per nursing staff.  Objective: Vitals:   06/18/21 1105 06/18/21 1110 06/18/21 1115 06/18/21 1142  BP:  (!) 142/75  (!) 152/88  Pulse: 90 80 82 78  Resp: 16 13 11 16   Temp:    98.5 F (36.9 C)  TempSrc:    Oral  SpO2: 100% 100% 100% 100%  Weight:      Height:        Intake/Output Summary (Last 24 hours) at 06/18/2021 1218 Last data filed at 06/18/2021 1052 Gross per 24 hour  Intake 3679.33 ml  Output --  Net 3679.33 ml   Filed Weights   06/16/21 1456 06/18/21 0859  Weight: 72.6 kg 72.6 kg    Examination:  General exam: Appears calm and comfortable  Respiratory system: Clear to auscultation. Respiratory effort normal.  On room air Cardiovascular system: S1 & S2 heard, RRR. No JVD, murmurs, rubs, gallops or clicks. No pedal edema. Gastrointestinal system: Abdomen is nondistended, soft and nontender. No organomegaly or masses felt. Normal bowel sounds heard. Central nervous system: Alert and oriented. No focal neurological deficits. Extremities: Symmetric 5 x 5 power. Skin: No rashes, lesions or ulcers Psychiatry: Judgement and insight appear normal. Mood & affect appropriate.     Data Reviewed: I have personally reviewed following labs and imaging studies  CBC: Recent Labs  Lab 06/16/21 1549  06/17/21 0427 06/17/21 1822 06/18/21 0442  WBC 6.0 7.2  --  7.5  NEUTROABS 4.5 4.8  --  4.6  HGB 7.7* 8.3* 9.4* 8.4*  HCT 27.8* 29.0* 32.3* 29.3*  MCV 74.7* 76.7*  --  77.7*  PLT 453* 420*  --  428*   Basic Metabolic Panel: Recent Labs  Lab 06/16/21 1549 06/17/21 0429 06/18/21 0442  NA 136 135 138  K 3.3* 3.0* 4.0  CL 104 103 114*  CO2 21* 22 20*  GLUCOSE 108* 82 85  BUN 6 8 6   CREATININE 0.59 0.53 0.55  CALCIUM 8.9 8.6* 8.5*  MG 1.9  --  2.0   GFR: Estimated Creatinine Clearance: 91.1 mL/min (by C-G formula based on SCr of 0.55 mg/dL). Liver Function Tests: Recent Labs  Lab 06/16/21 1549 06/17/21 0429  AST 25 24  ALT 16 14  ALKPHOS 93 85  BILITOT 0.7 0.3  PROT 7.3 6.8  ALBUMIN 4.0 3.7   Recent Labs  Lab 06/16/21 1549  LIPASE 25   No results for input(s): AMMONIA in the last 168 hours. Coagulation Profile: No results for input(s): INR, PROTIME in the last 168 hours. Cardiac Enzymes: No results for input(s): CKTOTAL, CKMB, CKMBINDEX, TROPONINI in the last 168 hours. BNP (last 3 results) No results for input(s): PROBNP in the last 8760 hours. HbA1C: No  results for input(s): HGBA1C in the last 72 hours. CBG: No results for input(s): GLUCAP in the last 168 hours. Lipid Profile: No results for input(s): CHOL, HDL, LDLCALC, TRIG, CHOLHDL, LDLDIRECT in the last 72 hours. Thyroid Function Tests: Recent Labs    06/16/21 2027  TSH 1.429   Anemia Panel: Recent Labs    06/16/21 2027  VITAMINB12 374  FOLATE 12.7  FERRITIN 430*  TIBC 424  IRON 385*  RETICCTPCT 1.8   Sepsis Labs: No results for input(s): PROCALCITON, LATICACIDVEN in the last 168 hours.  Recent Results (from the past 240 hour(s))  Resp Panel by RT-PCR (Flu A&B, Covid) Nasopharyngeal Swab     Status: None   Collection Time: 06/16/21  9:02 PM   Specimen: Nasopharyngeal Swab; Nasopharyngeal(NP) swabs in vial transport medium  Result Value Ref Range Status   SARS Coronavirus 2 by RT PCR  NEGATIVE NEGATIVE Final    Comment: (NOTE) SARS-CoV-2 target nucleic acids are NOT DETECTED.  The SARS-CoV-2 RNA is generally detectable in upper respiratory specimens during the acute phase of infection. The lowest concentration of SARS-CoV-2 viral copies this assay can detect is 138 copies/mL. A negative result does not preclude SARS-Cov-2 infection and should not be used as the sole basis for treatment or other patient management decisions. A negative result may occur with  improper specimen collection/handling, submission of specimen other than nasopharyngeal swab, presence of viral mutation(s) within the areas targeted by this assay, and inadequate number of viral copies(<138 copies/mL). A negative result must be combined with clinical observations, patient history, and epidemiological information. The expected result is Negative.  Fact Sheet for Patients:  BloggerCourse.com  Fact Sheet for Healthcare Providers:  SeriousBroker.it  This test is no t yet approved or cleared by the [place] FDA and  has been authorized for detection and/or diagnosis of SARS-CoV-2 by FDA under an Emergency Use Authorization (EUA). This EUA will remain  in effect (meaning this test can be used) for the duration of the COVID-19 declaration under Section 564(b)(1) of the Act, 21 U.S.C.section 360bbb-3(b)(1), unless the authorization is terminated  or revoked sooner.       Influenza A by PCR NEGATIVE NEGATIVE Final   Influenza B by PCR NEGATIVE NEGATIVE Final    Comment: (NOTE) The Xpert Xpress SARS-CoV-2/FLU/RSV plus assay is intended as an aid in the diagnosis of influenza from Nasopharyngeal swab specimens and should not be used as a sole basis for treatment. Nasal washings and aspirates are unacceptable for Xpert Xpress SARS-CoV-2/FLU/RSV testing.  Fact Sheet for Patients: BloggerCourse.com  Fact Sheet for  Healthcare Providers: SeriousBroker.it  This test is not yet approved or cleared by the [place] FDA and has been authorized for detection and/or diagnosis of SARS-CoV-2 by FDA under an Emergency Use Authorization (EUA). This EUA will remain in effect (meaning this test can be used) for the duration of the COVID-19 declaration under Section 564(b)(1) of the Act, 21 U.S.C. section 360bbb-3(b)(1), unless the authorization is terminated or revoked.  Performed at [facility], [address] [place], [place] [address]          Radiology Studies: DG Chest 2 View  Result Date: 06/16/2021 CLINICAL DATA:  Chest pain, fever, cough EXAM: CHEST - 2 VIEW COMPARISON:  None. FINDINGS: The heart size and mediastinal contours are within normal limits. Both lungs are clear. The visualized skeletal structures are unremarkable. IMPRESSION: No active cardiopulmonary disease. Electronically Signed   By: Ernie Avena M.D.   On:  06/16/2021 15:46   CT HEAD WO CONTRAST ( )  Result Date: 06/16/2021 CLINICAL DATA:  Chronic headache. EXAM: CT HEAD WITHOUT CONTRAST TECHNIQUE: Contiguous axial images were obtained from the base of the skull through the vertex without intravenous contrast. COMPARISON:  None. FINDINGS: Brain: No evidence of acute infarction, hemorrhage, hydrocephalus, extra-axial collection or mass lesion/mass effect. Vascular: No hyperdense vessel or unexpected calcification. Skull: Normal. Negative for fracture or focal lesion. Sinuses/Orbits: Globes and orbits are unremarkable. Visualized sinuses are clear. Other: None. IMPRESSION: Normal unenhanced CT scan of the brain. Electronically Signed   By: Amie Portland M.D.   On: 06/16/2021 15:31        Scheduled Meds:  amLODipine  10 mg Oral Daily   folic acid  1 mg Oral Daily   multivitamin with minerals  1 tablet Oral Daily   pantoprazole (PROTONIX) IV  40 mg Intravenous Q12H    thiamine  100 mg Oral Daily   Or   thiamine  100 mg Intravenous Daily   Continuous Infusions:  sodium chloride       LOS: 2 days    Time spent: 38 minutes spent on chart review, discussion with nursing staff, consultants, updating family and interview/physical exam; more than 50% of that time was spent in counseling and/or coordination of care.    Alvira Philips [place], DO [facility] Available via Epic secure chat 7am-7pm After these hours, please refer to coverage provider listed on [email or web address] 06/18/2021, 12:18 PM

## 2021-06-18 NOTE — Op Note (Signed)
[facility] Patient Name: Cheryl Daniels Procedure Date: 06/18/2021 MRN: [id number] Attending MD: Willis Modena , MD Date of Birth: 1981/04/02 CSN: [id number] Age: 40 Admit Type: Inpatient Procedure:                Colonoscopy Indications:              This is the patient's first colonoscopy,                            Hematochezia, Iron deficiency anemia, Family                            history of colon cancer in a first-degree relative                            before age 30 years Providers:                Willis Modena, MD, Janae Sauce. Steele Berg, RN, Rozetta Nunnery, Technician Referring MD:             [facility] Medicines:                Monitored Anesthesia Care Complications:            No immediate complications. Estimated Blood Loss:     Estimated blood loss: none. Procedure:                Pre-Anesthesia Assessment:                           - Prior to the procedure, a History and Physical                            was performed, and patient medications and                            allergies were reviewed. The patient's tolerance of                            previous anesthesia was also reviewed. The risks                            and benefits of the procedure and the sedation                            options and risks were discussed with the patient.                            All questions were answered, and informed consent                            was obtained. Prior Anticoagulants: The patient has                            taken no  previous anticoagulant or antiplatelet                            agents. ASA Grade Assessment: III - A patient with                            severe systemic disease. After reviewing the risks                            and benefits, the patient was deemed in                            satisfactory condition to undergo the procedure.                           - Prior to the  procedure, a History and Physical                            was performed, and patient medications and                            allergies were reviewed. The patient's tolerance of                            previous anesthesia was also reviewed. The risks                            and benefits of the procedure and the sedation                            options and risks were discussed with the patient.                            All questions were answered, and informed consent                            was obtained. Prior Anticoagulants: The patient has                            taken no previous anticoagulant or antiplatelet                            agents. ASA Grade Assessment: III - A patient with                            severe systemic disease. After reviewing the risks                            and benefits, the patient was deemed in                            satisfactory condition to undergo the procedure.  After obtaining informed consent, the colonoscope                            was passed under direct vision. Throughout the                            procedure, the patient's blood pressure, pulse, and                            oxygen saturations were monitored continuously. The                            [id number]-HQ190L ([id number]) Olympus colonoscope was                            introduced through the anus and advanced to the the                            terminal ileum, with identification of the                            appendiceal orifice and IC valve. The terminal                            ileum, ileocecal valve, appendiceal orifice, and                            rectum were photographed. The entire colon was                            examined. The colonoscopy was performed without                            difficulty. The patient tolerated the procedure                            well. The quality of the bowel preparation was                             adequate. Scope In: 10:32:16 AM Scope Out: 10:49:44 AM Scope Withdrawal Time: 0 hours 11 minutes 0 seconds  Total Procedure Duration: 0 hours 17 minutes 28 seconds  Findings:      Hemorrhoids were found on perianal exam.      Internal hemorrhoids were found during retroflexion. The hemorrhoids       were moderate.      No additional abnormalities were found on retroflexion.      Colon otherwise normal; no other polyps, masses, vascular ectasias, or       inflammatory changes were seen.      The distal 5 cm of the terminal ileum appeared normal.      No old or fresh blood was seen to the extent of our examination. Impression:               - Hemorrhoids found on perianal exam.                           -  Internal hemorrhoids.                           - The examined portion of the ileum was normal.                           - Otherwise normal to terminal ileum.                           - No source of iron-deficiency anemia seen on                            endoscopy or colonoscopy. Do not think her chronic                            anemia is GI-tract mediated. Hemorrhoids likely                            source of intermittent hematochezia. Moderate Sedation:      None Recommendation:           - Return patient to hospital ward for ongoing care.                           - Advance diet as tolerated today.                           - Continue present medications.                           - Perform a serologic workup for celiac disease.                           - Check H. pylori serologies today.                           - Given family history of colon cancer (father),                            would repeat colonoscopy in 5 years.                           - Topical hemorrhoidal therapies (e.g.,                            Preparation-H, Tucks pads) as needed for                            hemorrhoidal bleeding.                           - Patient can be  discharged today (after labs for                            celiac/HP done) from GI perspective.                           -  [facility] will sign-off; we'll arrange outpatient                            follow-up with [place] and will follow-up as outpatient                            celiac/HP labs ordered; please call with any                            questions; thank you for the consultation. Procedure Code(s):        --- Professional ---                           [phone], Colonoscopy, flexible; diagnostic, including                            collection of specimen(s) by brushing or washing,                            when performed (separate procedure) Diagnosis Code(s):        --- Professional ---                           K64.8, Other hemorrhoids                           K92.1, Melena (includes Hematochezia)                           D50.9, Iron deficiency anemia, unspecified                           Z80.0, Family history of malignant neoplasm of                            digestive organs CPT copyright 2019 American Medical Association. All rights reserved. The codes documented in this report are preliminary and upon coder review may  be revised to meet current compliance requirements. Willis Modena, MD 06/18/2021 11:01:59 AM This report has been signed electronically. Number of Addenda: 0

## 2021-06-18 NOTE — Interval H&P Note (Signed)
History and Physical Interval Note:  06/18/2021 10:01 AM  [name]  has presented today for surgery, with the diagnosis of anemia, hematochezia.  The various methods of treatment have been discussed with the patient and family. After consideration of risks, benefits and other options for treatment, the patient has consented to  Procedure(s): ESOPHAGOGASTRODUODENOSCOPY (EGD) WITH PROPOFOL (N/A) COLONOSCOPY WITH PROPOFOL (Left) as a surgical intervention.  The patient's history has been reviewed, patient examined, no change in status, stable for surgery.  I have reviewed the patient's chart and labs.  Questions were answered to the patient's satisfaction.     Cheryl Daniels

## 2021-06-18 NOTE — Transfer of Care (Signed)
Immediate Anesthesia Transfer of Care Note  Patient: Cheryl Daniels  Procedure(s) Performed: Procedure(s): ESOPHAGOGASTRODUODENOSCOPY (EGD) WITH PROPOFOL (N/A) COLONOSCOPY WITH PROPOFOL (Left)  Patient Location: PACU and Endoscopy Unit  Anesthesia Type:MAC  Level of Consciousness: awake, alert  and oriented  Airway & Oxygen Therapy: Patient Spontanous Breathing and Patient connected to nasal cannula oxygen  Post-op Assessment: Report given to RN and Post -op Vital signs reviewed and stable  Post vital signs: Reviewed and stable  Last Vitals:  Vitals:   06/18/21 0630 06/18/21 0859  BP: (!) 134/95 (!) 151/84  Pulse: 83 89  Resp: 16 14  Temp: 36.8 C 36.9 C  SpO2: 803% 21%    Complications: No apparent anesthesia complications

## 2021-06-18 NOTE — Anesthesia Postprocedure Evaluation (Signed)
Anesthesia Post Note  Patient: Cheryl Daniels  Procedure(s) Performed: ESOPHAGOGASTRODUODENOSCOPY (EGD) WITH PROPOFOL COLONOSCOPY WITH PROPOFOL (Left)     Anesthesia Post Evaluation No notable events documented.  Last Vitals:  Vitals:   06/18/21 1308 06/18/21 1700  BP: 132/87 120/88  Pulse: 92 94  Resp: 18 18  Temp: 37.2 C 36.9 C  SpO2: 100% 100%    Last Pain:  Vitals:   06/18/21 1700  TempSrc: Oral  PainSc:                  Pooja Camuso

## 2021-06-19 ENCOUNTER — Encounter (HOSPITAL_COMMUNITY): Payer: Self-pay | Admitting: Gastroenterology

## 2021-06-19 DIAGNOSIS — D649 Anemia, unspecified: Secondary | ICD-10-CM | POA: Diagnosis not present

## 2021-06-19 LAB — TYPE AND SCREEN
ABO/RH(D): A POS
Antibody Screen: NEGATIVE
Unit division: 0

## 2021-06-19 LAB — CBC WITH DIFFERENTIAL/PLATELET
Abs Immature Granulocytes: 0.12 10*3/uL — ABNORMAL HIGH (ref 0.00–0.07)
Basophils Absolute: 0.1 10*3/uL (ref 0.0–0.1)
Basophils Relative: 1 %
Eosinophils Absolute: 0.3 10*3/uL (ref 0.0–0.5)
Eosinophils Relative: 3 %
HCT: 28.5 % — ABNORMAL LOW (ref 36.0–46.0)
Hemoglobin: 8.4 g/dL — ABNORMAL LOW (ref 12.0–15.0)
Immature Granulocytes: 1 %
Lymphocytes Relative: 25 %
Lymphs Abs: 2.2 10*3/uL (ref 0.7–4.0)
MCH: 22.8 pg — ABNORMAL LOW (ref 26.0–34.0)
MCHC: 29.5 g/dL — ABNORMAL LOW (ref 30.0–36.0)
MCV: 77.4 fL — ABNORMAL LOW (ref 80.0–100.0)
Monocytes Absolute: 0.6 10*3/uL (ref 0.1–1.0)
Monocytes Relative: 7 %
Neutro Abs: 5.6 10*3/uL (ref 1.7–7.7)
Neutrophils Relative %: 63 %
Platelets: 477 10*3/uL — ABNORMAL HIGH (ref 150–400)
RBC: 3.68 MIL/uL — ABNORMAL LOW (ref 3.87–5.11)
RDW: 21.4 % — ABNORMAL HIGH (ref 11.5–15.5)
WBC: 8.9 10*3/uL (ref 4.0–10.5)
nRBC: 1.9 % — ABNORMAL HIGH (ref 0.0–0.2)

## 2021-06-19 LAB — BASIC METABOLIC PANEL
Anion gap: 7 (ref 5–15)
BUN: 11 mg/dL (ref 6–20)
CO2: 23 mmol/L (ref 22–32)
Calcium: 8.5 mg/dL — ABNORMAL LOW (ref 8.9–10.3)
Chloride: 108 mmol/L (ref 98–111)
Creatinine, Ser: 0.53 mg/dL (ref 0.44–1.00)
GFR, Estimated: >60 mL/min (ref >60)
Glucose, Bld: 112 mg/dL — ABNORMAL HIGH (ref 70–99)
Potassium: 3.5 mmol/L (ref 3.5–5.1)
Sodium: 138 mmol/L (ref 135–145)

## 2021-06-19 LAB — H. PYLORI ANTIBODY, IGG: H Pylori IgG: 0.19 Index Value (ref 0.00–0.79)

## 2021-06-19 LAB — BPAM RBC
Blood Product Expiration Date: 202212142359
ISSUE DATE / TIME: 202211252226
Unit Type and Rh: 6200

## 2021-06-19 LAB — IGG, IGA, IGM
IgA: 234 mg/dL (ref 87–352)
IgG (Immunoglobin G), Serum: 677 mg/dL (ref 586–1602)
IgM (Immunoglobulin M), Srm: 119 mg/dL (ref 26–217)

## 2021-06-19 LAB — TISSUE TRANSGLUTAMINASE, IGA: Tissue Transglutaminase Ab, IgA: <2 U/mL (ref 0–3)

## 2021-06-19 MED ORDER — AMLODIPINE BESYLATE 10 MG PO TABS: 10.0000 mg | ORAL_TABLET | Freq: Every day | ORAL | 2 refills | Status: DC

## 2021-06-19 MED ORDER — PANTOPRAZOLE SODIUM 40 MG PO TBEC: 40.0000 mg | DELAYED_RELEASE_TABLET | Freq: Every day | ORAL | 2 refills | Status: DC

## 2021-06-19 NOTE — Progress Notes (Signed)
Pt alert and oriented. D/C instructions given. Pt d/cd to home. 

## 2021-06-19 NOTE — Progress Notes (Signed)
Chaplain engaged in an initial visit with Cheryl Daniels and her husband.  Chaplain provided Scientist, water quality, Healthcare POA education.  [name] conveyed that she wanted to appoint her husband as her healthcare agent.  Chaplain expressed to [name] that because they are legally married there is already an agreement in place for medical staff to honor by contacting her husband if she is unable to speak for herself.  [name] also stated that she and her husband have had a conversation about her wants and medical needs.  He is well aware of what she desires. Chaplain left paperwork with [name].    Chaplain is available to follow-up as needed.    06/19/21 1000  Clinical Encounter Type  Visited With Patient and family together  Visit Type Initial;Social support

## 2021-06-19 NOTE — Discharge Summary (Signed)
Physician Discharge Summary  Cheryl Daniels BJS:[id number] DOB: 1980/08/04 DOA: 06/16/2021  PCP: Cheryl Caller, MD  Admit date: 06/16/2021 Discharge date: 06/19/2021  Admitted From: Home Disposition: Home  Recommendations for Outpatient Follow-up:  Follow up with PCP in 1-2 weeks Follow-up with Dr. Hinda Daniels gastroenterology Please obtain BMP/CBC in one week Please follow up on the following pending results: H. pylori, tissue transglutaminase IgG, IgM, IgA, IgG which were pending at time of discharge.  Home Health: No Equipment/Devices: None  Discharge Condition: Stable CODE STATUS: Full code Diet recommendation: Heart healthy diet  History of present illness:  Cheryl Daniels is a 40 year old female with past medical history significant for essential hypertension, chronic iron deficiency anemia, chronic headache, history of hemorrhoids, NSAID abuse, alcohol use, tobacco use disorder who presents to [facility] ED on 11/25 with complaint of dizziness, fatigue, shortness of breath and intermittent chest pain.  Onset 2 weeks ago with progressive worsening.  Also reports chronic frontal headache in which she takes [place] powder frequently.  Also endorses EtOH use daily with 16 ounces of wine.  Also reports blood in her stool usually daily.   In the ED, temperature 98.2 F, HR 117, RR 18, BP 162/112, SPO2 100% on room air.WBC 6.0, hemoglobin 7.7, platelets 453.  Sodium 136, potassium 3.3, chloride 104, CO2 21, glucose 108, BUN 6, creatinine 0.59.  AST 25, ALT 25.  Lipase 25, total bilirubin 0.7.  High sensitive troponin <2, 4.  hCG negative.  TSH 1.429.  Covid-19 PCR negative.  Influenza A/B PCR negative.  CT head without contrast with no acute intracranial abnormality.  EKG with sinus tachycardia, rate 103, QTc 451, no concerning dynamic changes.  Order for 1 unit PRBC by EDP.  TRH consulted for further evaluation and management of symptomatic anemia with concern for GI bleed given Goody powder use     Hospital course:  Symptomatic anemia Hx IDA Patient presenting to the ED with dizziness, shortness of breath and intermittent chest pain.  History of IDA, follows with [facility] hematology with last iron infusion on 06/14/2021.  Hemoglobin 7.7 on admission.  Anemia panel with iron 385, TIBC 424, ferritin 430, folate 12.7, B12 374.  FOBT positive. Complicated by continuous use of Goody powder for frontal headache.  Concern for gastric ulcer. Patient also reports history of hemorrhoids.  Denies heavy menses.  Gastroenterology, Dr. Dulce Daniels was consulted and patient underwent EGD/colonoscopy on 06/18/2021.  EGD with small hiatal hernia, LA grade a esophagitis, normal stomach/duodenal bulb, normal first/second portion of duodenum.  Colonoscopy notable for external/internal hemorrhoids, normal ileum and no other findings.  Hemoglobin remained stable, 8.4 at time of discharge following 1 unit PRBC on 06/16/2021.  Discussed discontinuation of NSAIDs/Goody powder.  H. pylori, IgG, IgA, IgM, tissue transglutaminase IgA pending at time of discharge.  Patient to follow-up with gastroenterology, Dr. Dulce Daniels outpatient.  Recommend CBC 1 week and continued outpatient follow-up with hematology.   Hypertensive urgency Patient with persistent elevated blood pressure, currently not on antihypertensives.  Discussed with patient likely need to reinitiate antihypertensive therapy given her persistent headaches which is likely related.  Discharged on amlodipine 10 mg p.o. daily.  Outpatient follow-up with PCP.    Hypokalemia Repleted during hospitalization, potassium 3.5 at time of discharge.  Recommend repeat BMP 1 week.    EtOH use disorder Patient reports daily alcohol use with 16 ounces of wine daily.  Denies history of withdrawal symptoms. Counseled on need for cessation given her anemia   Tobacco use disorder  Reports quitting 7 years ago.  Continue tobacco cessation  Discharge Diagnoses:  Principal  Problem:   Symptomatic anemia Active Problems:   Hypertensive urgency   Chest pain   Dizziness   Chronic diarrhea   Hemorrhoids   BRBPR (bright red blood per rectum)   Iron deficiency anemia due to chronic blood loss   Alcohol use   GERD (gastroesophageal reflux disease)   Chronic headache   long-term use of Goody's   Folate deficiency   Facial rash   Nausea & vomiting    Discharge Instructions  Discharge Instructions     Call MD for:  difficulty breathing, headache or visual disturbances   Complete by: As directed    Call MD for:  extreme fatigue   Complete by: As directed    Call MD for:  persistant dizziness or light-headedness   Complete by: As directed    Call MD for:  persistant nausea and vomiting   Complete by: As directed    Call MD for:  severe uncontrolled pain   Complete by: As directed    Call MD for:  temperature >100.4   Complete by: As directed    Diet - low sodium heart healthy   Complete by: As directed    Increase activity slowly   Complete by: As directed       Allergies as of 06/19/2021   No Known Allergies      Medication List     STOP taking these medications    BC HEADACHE PO       TAKE these medications    amLODipine 10 MG tablet Commonly known as: NORVASC Take 1 tablet (10 mg total) by mouth daily.   pantoprazole 40 MG tablet Commonly known as: PROTONIX Take 1 tablet (40 mg total) by mouth daily.        Follow-up Information     Cheryl Caller, MD. Schedule an appointment as soon as possible for a visit in 1 week(s).   Specialty: Family Medicine Contact information: [address] [place] [address] [phone]         Willis Modena, MD Follow up.   Specialty: Gastroenterology Contact information: [address] [place] [address] [phone]                No Known Allergies  Consultations: [facility] gastroenterology, Dr. Dulce Daniels   Procedures/Studies: DG Chest 2  View  Result Date: 06/16/2021 CLINICAL DATA:  Chest pain, fever, cough EXAM: CHEST - 2 VIEW COMPARISON:  None. FINDINGS: The heart size and mediastinal contours are within normal limits. Both lungs are clear. The visualized skeletal structures are unremarkable. IMPRESSION: No active cardiopulmonary disease. Electronically Signed   By: Ernie Avena M.D.   On: 06/16/2021 15:46   CT HEAD WO CONTRAST ( )  Result Date: 06/16/2021 CLINICAL DATA:  Chronic headache. EXAM: CT HEAD WITHOUT CONTRAST TECHNIQUE: Contiguous axial images were obtained from the base of the skull through the vertex without intravenous contrast. COMPARISON:  None. FINDINGS: Brain: No evidence of acute infarction, hemorrhage, hydrocephalus, extra-axial collection or mass lesion/mass effect. Vascular: No hyperdense vessel or unexpected calcification. Skull: Normal. Negative for fracture or focal lesion. Sinuses/Orbits: Globes and orbits are unremarkable. Visualized sinuses are clear. Other: None. IMPRESSION: Normal unenhanced CT scan of the brain. Electronically Signed   By: Amie Portland M.D.   On: 06/16/2021 15:31     Subjective: Patient seen examined at bedside, resting comfortably.  No complaints this morning.  Discharging home as  hemoglobin remained stable.  Will need outpatient follow-up with gastroenterology for results of pending tests.  No other questions or concerns at this time.  Denies headache, no chest pain, no palpitation, no shortness of breath, no abdominal pain, no weakness, no fatigue, no paresthesias.  No acute events overnight per nursing staff.  Discharge Exam: Vitals:   06/19/21 0120 06/19/21 0513  BP: (!) 138/93 (!) 128/48  Pulse: 79 80  Resp: 16 16  Temp: 98.2 F (36.8 C) 98 F (36.7 C)  SpO2: 100% 100%   Vitals:   06/18/21 1308 06/18/21 1700 06/19/21 0120 06/19/21 0513  BP: 132/87 120/88 (!) 138/93 (!) 128/48  Pulse: 92 94 79 80  Resp: 18 18 16 16   Temp: 99 F (37.2 C) 98.5 F (36.9 C)  98.2 F (36.8 C) 98 F (36.7 C)  TempSrc: Oral Oral Oral Oral  SpO2: 100% 100% 100% 100%  Weight:      Height:        General: Pt is alert, awake, not in acute distress Cardiovascular: RRR, S1/S2 +, no rubs, no gallops Respiratory: CTA bilaterally, no wheezing, no rhonchi, on room air Abdominal: Soft, NT, ND, bowel sounds + Extremities: no edema, no cyanosis    The results of significant diagnostics from this hospitalization (including imaging, microbiology, ancillary and laboratory) are listed below for reference.     Microbiology: Recent Results (from the past 240 hour(s))  Resp Panel by RT-PCR (Flu A&B, Covid) Nasopharyngeal Swab     Status: None   Collection Time: 06/16/21  9:02 PM   Specimen: Nasopharyngeal Swab; Nasopharyngeal(NP) swabs in vial transport medium  Result Value Ref Range Status   SARS Coronavirus 2 by RT PCR NEGATIVE NEGATIVE Final    Comment: (NOTE) SARS-CoV-2 target nucleic acids are NOT DETECTED.  The SARS-CoV-2 RNA is generally detectable in upper respiratory specimens during the acute phase of infection. The lowest concentration of SARS-CoV-2 viral copies this assay can detect is 138 copies/mL. A negative result does not preclude SARS-Cov-2 infection and should not be used as the sole basis for treatment or other patient management decisions. A negative result may occur with  improper specimen collection/handling, submission of specimen other than nasopharyngeal swab, presence of viral mutation(s) within the areas targeted by this assay, and inadequate number of viral copies(<138 copies/mL). A negative result must be combined with clinical observations, patient history, and epidemiological information. The expected result is Negative.  Fact Sheet for Patients:  BloggerCourse.com  Fact Sheet for Healthcare Providers:  SeriousBroker.it  This test is no t yet approved or cleared by the [place] FDA and  has been authorized for detection and/or diagnosis of SARS-CoV-2 by FDA under an Emergency Use Authorization (EUA). This EUA will remain  in effect (meaning this test can be used) for the duration of the COVID-19 declaration under Section 564(b)(1) of the Act, 21 U.S.C.section 360bbb-3(b)(1), unless the authorization is terminated  or revoked sooner.       Influenza A by PCR NEGATIVE NEGATIVE Final   Influenza B by PCR NEGATIVE NEGATIVE Final    Comment: (NOTE) The Xpert Xpress SARS-CoV-2/FLU/RSV plus assay is intended as an aid in the diagnosis of influenza from Nasopharyngeal swab specimens and should not be used as a sole basis for treatment. Nasal washings and aspirates are unacceptable for Xpert Xpress SARS-CoV-2/FLU/RSV testing.  Fact Sheet for Patients: BloggerCourse.com  Fact Sheet for Healthcare Providers: SeriousBroker.it  This test is not yet approved or cleared by the [place] FDA  and has been authorized for detection and/or diagnosis of SARS-CoV-2 by FDA under an Emergency Use Authorization (EUA). This EUA will remain in effect (meaning this test can be used) for the duration of the COVID-19 declaration under Section 564(b)(1) of the Act, 21 U.S.C. section 360bbb-3(b)(1), unless the authorization is terminated or revoked.  Performed at [facility], [address] [place], [place] [address]      Labs: BNP (last 3 results) No results for input(s): BNP in the last 8760 hours. Basic Metabolic Panel: Recent Labs  Lab 06/16/21 1549 06/17/21 0429 06/18/21 0442 06/19/21 0432  NA 136 135 138 138  K 3.3* 3.0* 4.0 3.5  CL 104 103 114* 108  CO2 21* 22 20* 23  GLUCOSE 108* 82 85 112*  BUN 6 8 6 11   CREATININE 0.59 0.53 0.55 0.53  CALCIUM 8.9 8.6* 8.5* 8.5*  MG 1.9  --  2.0  --    Liver Function Tests: Recent Labs  Lab 06/16/21 1549 06/17/21 0429  AST 25 24  ALT 16  14  ALKPHOS 93 85  BILITOT 0.7 0.3  PROT 7.3 6.8  ALBUMIN 4.0 3.7   Recent Labs  Lab 06/16/21 1549  LIPASE 25   No results for input(s): AMMONIA in the last 168 hours. CBC: Recent Labs  Lab 06/16/21 1549 06/17/21 0427 06/17/21 1822 06/18/21 0442 06/19/21 0432  WBC 6.0 7.2  --  7.5 8.9  NEUTROABS 4.5 4.8  --  4.6 5.6  HGB 7.7* 8.3* 9.4* 8.4* 8.4*  HCT 27.8* 29.0* 32.3* 29.3* 28.5*  MCV 74.7* 76.7*  --  77.7* 77.4*  PLT 453* 420*  --  428* 477*   Cardiac Enzymes: No results for input(s): CKTOTAL, CKMB, CKMBINDEX, TROPONINI in the last 168 hours. BNP: Invalid input(s): POCBNP CBG: No results for input(s): GLUCAP in the last 168 hours. D-Dimer No results for input(s): DDIMER in the last 72 hours. Hgb A1c No results for input(s): HGBA1C in the last 72 hours. Lipid Profile No results for input(s): CHOL, HDL, LDLCALC, TRIG, CHOLHDL, LDLDIRECT in the last 72 hours. Thyroid function studies Recent Labs    06/16/21 2027  TSH 1.429   Anemia work up Recent Labs    06/16/21 2027  VITAMINB12 374  FOLATE 12.7  FERRITIN 430*  TIBC 424  IRON 385*  RETICCTPCT 1.8   Urinalysis    Component Value Date/Time   COLORURINE YELLOW 06/17/2021 0206   APPEARANCEUR CLEAR 06/17/2021 0206   LABSPEC 1.032 (H) 06/17/2021 0206   PHURINE 5.0 06/17/2021 0206   GLUCOSEU NEGATIVE 06/17/2021 0206   HGBUR NEGATIVE 06/17/2021 0206   BILIRUBINUR NEGATIVE 06/17/2021 0206   KETONESUR 20 (A) 06/17/2021 0206   PROTEINUR NEGATIVE 06/17/2021 0206   UROBILINOGEN 0.2 09/13/2009 1802   NITRITE NEGATIVE 06/17/2021 0206   LEUKOCYTESUR NEGATIVE 06/17/2021 0206   Sepsis Labs Invalid input(s): PROCALCITONIN,  WBC,  LACTICIDVEN Microbiology Recent Results (from the past 240 hour(s))  Resp Panel by RT-PCR (Flu A&B, Covid) Nasopharyngeal Swab     Status: None   Collection Time: 06/16/21  9:02 PM   Specimen: Nasopharyngeal Swab; Nasopharyngeal(NP) swabs in vial transport medium  Result Value Ref  Range Status   SARS Coronavirus 2 by RT PCR NEGATIVE NEGATIVE Final    Comment: (NOTE) SARS-CoV-2 target nucleic acids are NOT DETECTED.  The SARS-CoV-2 RNA is generally detectable in upper respiratory specimens during the acute phase of infection. The lowest concentration of SARS-CoV-2 viral copies this assay can detect is 138 copies/mL.  A negative result does not preclude SARS-Cov-2 infection and should not be used as the sole basis for treatment or other patient management decisions. A negative result may occur with  improper specimen collection/handling, submission of specimen other than nasopharyngeal swab, presence of viral mutation(s) within the areas targeted by this assay, and inadequate number of viral copies(<138 copies/mL). A negative result must be combined with clinical observations, patient history, and epidemiological information. The expected result is Negative.  Fact Sheet for Patients:  BloggerCourse.com  Fact Sheet for Healthcare Providers:  SeriousBroker.it  This test is no t yet approved or cleared by the [place] FDA and  has been authorized for detection and/or diagnosis of SARS-CoV-2 by FDA under an Emergency Use Authorization (EUA). This EUA will remain  in effect (meaning this test can be used) for the duration of the COVID-19 declaration under Section 564(b)(1) of the Act, 21 U.S.C.section 360bbb-3(b)(1), unless the authorization is terminated  or revoked sooner.       Influenza A by PCR NEGATIVE NEGATIVE Final   Influenza B by PCR NEGATIVE NEGATIVE Final    Comment: (NOTE) The Xpert Xpress SARS-CoV-2/FLU/RSV plus assay is intended as an aid in the diagnosis of influenza from Nasopharyngeal swab specimens and should not be used as a sole basis for treatment. Nasal washings and aspirates are unacceptable for Xpert Xpress SARS-CoV-2/FLU/RSV testing.  Fact Sheet for  Patients: BloggerCourse.com  Fact Sheet for Healthcare Providers: SeriousBroker.it  This test is not yet approved or cleared by the [place] FDA and has been authorized for detection and/or diagnosis of SARS-CoV-2 by FDA under an Emergency Use Authorization (EUA). This EUA will remain in effect (meaning this test can be used) for the duration of the COVID-19 declaration under Section 564(b)(1) of the Act, 21 U.S.C. section 360bbb-3(b)(1), unless the authorization is terminated or revoked.  Performed at [facility], [address], [place], [place] [address]      Time coordinating discharge: Over 30 minutes  SIGNED:   Alvira Philips [place], DO  [facility] 06/19/2021, 9:48 AM

## 2023-02-25 ENCOUNTER — Other Ambulatory Visit: Payer: Self-pay

## 2023-06-16 ENCOUNTER — Emergency Department (HOSPITAL_COMMUNITY): Payer: Medicaid Other

## 2023-06-16 ENCOUNTER — Observation Stay (HOSPITAL_COMMUNITY): Payer: Medicaid Other

## 2023-06-16 ENCOUNTER — Inpatient Hospital Stay (HOSPITAL_COMMUNITY)
Admission: EM | Admit: 2023-06-16 | Discharge: 2023-06-20 | DRG: 872 | Disposition: A | Discharge status: Home / Self Care | Payer: Medicaid Other | Attending: Internal Medicine | Admitting: Internal Medicine

## 2023-06-16 ENCOUNTER — Encounter (HOSPITAL_COMMUNITY): Payer: Self-pay

## 2023-06-16 ENCOUNTER — Other Ambulatory Visit: Payer: Self-pay

## 2023-06-16 DIAGNOSIS — R9431 Abnormal electrocardiogram [ECG] [EKG]: Secondary | ICD-10-CM

## 2023-06-16 DIAGNOSIS — D500 Iron deficiency anemia secondary to blood loss (chronic): Secondary | ICD-10-CM | POA: Diagnosis not present

## 2023-06-16 DIAGNOSIS — R197 Diarrhea, unspecified: Secondary | ICD-10-CM

## 2023-06-16 DIAGNOSIS — D72818 Other decreased white blood cell count: Secondary | ICD-10-CM

## 2023-06-16 DIAGNOSIS — E538 Deficiency of other specified B group vitamins: Secondary | ICD-10-CM | POA: Diagnosis present

## 2023-06-16 DIAGNOSIS — Z8249 Family history of ischemic heart disease and other diseases of the circulatory system: Secondary | ICD-10-CM

## 2023-06-16 DIAGNOSIS — R6510 Systemic inflammatory response syndrome (SIRS) of non-infectious origin without acute organ dysfunction: Secondary | ICD-10-CM

## 2023-06-16 DIAGNOSIS — E871 Hypo-osmolality and hyponatremia: Secondary | ICD-10-CM | POA: Diagnosis present

## 2023-06-16 DIAGNOSIS — N390 Urinary tract infection, site not specified: Secondary | ICD-10-CM | POA: Diagnosis present

## 2023-06-16 DIAGNOSIS — N3001 Acute cystitis with hematuria: Principal | ICD-10-CM | POA: Diagnosis present

## 2023-06-16 DIAGNOSIS — A419 Sepsis, unspecified organism: Secondary | ICD-10-CM | POA: Diagnosis not present

## 2023-06-16 DIAGNOSIS — E876 Hypokalemia: Secondary | ICD-10-CM

## 2023-06-16 DIAGNOSIS — D649 Anemia, unspecified: Secondary | ICD-10-CM | POA: Insufficient documentation

## 2023-06-16 DIAGNOSIS — Z789 Other specified health status: Secondary | ICD-10-CM | POA: Diagnosis not present

## 2023-06-16 DIAGNOSIS — A072 Cryptosporidiosis: Secondary | ICD-10-CM | POA: Diagnosis present

## 2023-06-16 DIAGNOSIS — D709 Neutropenia, unspecified: Secondary | ICD-10-CM | POA: Diagnosis present

## 2023-06-16 DIAGNOSIS — F17200 Nicotine dependence, unspecified, uncomplicated: Secondary | ICD-10-CM | POA: Diagnosis present

## 2023-06-16 DIAGNOSIS — D72819 Decreased white blood cell count, unspecified: Secondary | ICD-10-CM | POA: Insufficient documentation

## 2023-06-16 DIAGNOSIS — N3000 Acute cystitis without hematuria: Secondary | ICD-10-CM

## 2023-06-16 DIAGNOSIS — Z720 Tobacco use: Secondary | ICD-10-CM

## 2023-06-16 DIAGNOSIS — Z1152 Encounter for screening for COVID-19: Secondary | ICD-10-CM

## 2023-06-16 DIAGNOSIS — J45909 Unspecified asthma, uncomplicated: Secondary | ICD-10-CM | POA: Diagnosis present

## 2023-06-16 DIAGNOSIS — I10 Essential (primary) hypertension: Secondary | ICD-10-CM | POA: Diagnosis present

## 2023-06-16 DIAGNOSIS — R519 Headache, unspecified: Secondary | ICD-10-CM | POA: Diagnosis present

## 2023-06-16 DIAGNOSIS — A414 Sepsis due to anaerobes: Principal | ICD-10-CM | POA: Diagnosis present

## 2023-06-16 DIAGNOSIS — Z79899 Other long term (current) drug therapy: Secondary | ICD-10-CM

## 2023-06-16 LAB — COMPREHENSIVE METABOLIC PANEL
ALT: 18 U/L (ref 0–44)
AST: 38 U/L (ref 15–41)
Albumin: 3.8 g/dL (ref 3.5–5.0)
Alkaline Phosphatase: 113 U/L (ref 38–126)
Anion gap: 14 (ref 5–15)
BUN: 11 mg/dL (ref 6–20)
CO2: 21 mmol/L — ABNORMAL LOW (ref 22–32)
Calcium: 9 mg/dL (ref 8.9–10.3)
Chloride: 98 mmol/L (ref 98–111)
Creatinine, Ser: 0.7 mg/dL (ref 0.44–1.00)
GFR, Estimated: >60 mL/min (ref >60)
Glucose, Bld: 123 mg/dL — ABNORMAL HIGH (ref 70–99)
Potassium: 3.1 mmol/L — ABNORMAL LOW (ref 3.5–5.1)
Sodium: 133 mmol/L — ABNORMAL LOW (ref 135–145)
Total Bilirubin: 0.5 mg/dL (ref <1.2)
Total Protein: 7.5 g/dL (ref 6.5–8.1)

## 2023-06-16 LAB — I-STAT CHEM 8, ED
BUN: 10 mg/dL (ref 6–20)
Calcium, Ion: 1.03 mmol/L — ABNORMAL LOW (ref 1.15–1.40)
Chloride: 101 mmol/L (ref 98–111)
Creatinine, Ser: 0.7 mg/dL (ref 0.44–1.00)
Glucose, Bld: 121 mg/dL — ABNORMAL HIGH (ref 70–99)
HCT: 41 % (ref 36.0–46.0)
Hemoglobin: 13.9 g/dL (ref 12.0–15.0)
Potassium: 3.2 mmol/L — ABNORMAL LOW (ref 3.5–5.1)
Sodium: 134 mmol/L — ABNORMAL LOW (ref 135–145)
TCO2: 26 mmol/L (ref 22–32)

## 2023-06-16 LAB — URINALYSIS, W/ REFLEX TO CULTURE (INFECTION SUSPECTED)
Glucose, UA: NEGATIVE mg/dL
Ketones, ur: NEGATIVE mg/dL
Nitrite: NEGATIVE
Protein, ur: 30 mg/dL — AB
Specific Gravity, Urine: 1.026 (ref 1.005–1.030)
pH: 5 (ref 5.0–8.0)

## 2023-06-16 LAB — RAPID URINE DRUG SCREEN, HOSP PERFORMED
Amphetamines: NOT DETECTED
Barbiturates: NOT DETECTED
Benzodiazepines: NOT DETECTED
Cocaine: NOT DETECTED
Opiates: NOT DETECTED
Tetrahydrocannabinol: POSITIVE — AB

## 2023-06-16 LAB — I-STAT CG4 LACTIC ACID, ED
Lactic Acid, Venous: 2.1 mmol/L (ref 0.5–1.9)
Lactic Acid, Venous: 1 mmol/L (ref 0.5–1.9)
Lactic Acid, Venous: <0.3 mmol/L — ABNORMAL LOW (ref 0.5–1.9)

## 2023-06-16 LAB — RESP PANEL BY RT-PCR (RSV, FLU A&B, COVID)  RVPGX2
Influenza A by PCR: NEGATIVE
Influenza B by PCR: NEGATIVE
Resp Syncytial Virus by PCR: NEGATIVE
SARS Coronavirus 2 by RT PCR: NEGATIVE

## 2023-06-16 LAB — CBC WITH DIFFERENTIAL/PLATELET
Abs Immature Granulocytes: 0.03 10*3/uL (ref 0.00–0.07)
Basophils Absolute: 0 10*3/uL (ref 0.0–0.1)
Basophils Relative: 0 %
Eosinophils Absolute: 0.2 10*3/uL (ref 0.0–0.5)
Eosinophils Relative: 6 %
HCT: 36.1 % (ref 36.0–46.0)
Hemoglobin: 12.1 g/dL (ref 12.0–15.0)
Immature Granulocytes: 1 %
Lymphocytes Relative: 31 %
Lymphs Abs: 0.9 10*3/uL (ref 0.7–4.0)
MCH: 30.9 pg (ref 26.0–34.0)
MCHC: 33.5 g/dL (ref 30.0–36.0)
MCV: 92.3 fL (ref 80.0–100.0)
Monocytes Absolute: 0.2 10*3/uL (ref 0.1–1.0)
Monocytes Relative: 7 %
Neutro Abs: 1.6 10*3/uL — ABNORMAL LOW (ref 1.7–7.7)
Neutrophils Relative %: 55 %
Platelets: 414 10*3/uL — ABNORMAL HIGH (ref 150–400)
RBC: 3.91 MIL/uL (ref 3.87–5.11)
RDW: 15.2 % (ref 11.5–15.5)
WBC: 2.9 10*3/uL — ABNORMAL LOW (ref 4.0–10.5)
nRBC: 1.4 % — ABNORMAL HIGH (ref 0.0–0.2)

## 2023-06-16 LAB — LIPASE, BLOOD: Lipase: 26 U/L (ref 11–51)

## 2023-06-16 LAB — APTT: aPTT: 28 s (ref 24–36)

## 2023-06-16 LAB — TROPONIN I (HIGH SENSITIVITY)
Troponin I (High Sensitivity): 3 ng/L (ref <18)
Troponin I (High Sensitivity): 3 ng/L (ref <18)

## 2023-06-16 LAB — PROTIME-INR
INR: 1 (ref 0.8–1.2)
Prothrombin Time: 13.1 s (ref 11.4–15.2)

## 2023-06-16 LAB — HCG, SERUM, QUALITATIVE: Preg, Serum: NEGATIVE

## 2023-06-16 MED ORDER — METRONIDAZOLE 500 MG/100ML IV SOLN
500.0000 mg | Freq: Once | INTRAVENOUS | Status: AC
  Administered 2023-06-16: 500 mg via INTRAVENOUS
  Filled 2023-06-16: qty 100

## 2023-06-16 MED ORDER — LACTATED RINGERS IV SOLN
150.0000 mL/h | INTRAVENOUS | Status: DC
  Administered 2023-06-16 – 2023-06-17 (×2): 150 mL/h via INTRAVENOUS

## 2023-06-16 MED ORDER — HYDROCODONE-ACETAMINOPHEN 5-325 MG PO TABS
1.0000 | ORAL_TABLET | ORAL | Status: DC | PRN
  Administered 2023-06-17 – 2023-06-18 (×3): 2 via ORAL
  Administered 2023-06-19: 1 via ORAL
  Administered 2023-06-20 (×2): 2 via ORAL
  Filled 2023-06-16 (×6): qty 2

## 2023-06-16 MED ORDER — LACTATED RINGERS IV BOLUS
1000.0000 mL | Freq: Once | INTRAVENOUS | Status: AC
  Administered 2023-06-16: 1000 mL via INTRAVENOUS

## 2023-06-16 MED ORDER — SODIUM CHLORIDE 0.9 % IV SOLN
2.0000 g | Freq: Three times a day (TID) | INTRAVENOUS | Status: DC
  Administered 2023-06-17: 2 g via INTRAVENOUS
  Filled 2023-06-16: qty 12.5

## 2023-06-16 MED ORDER — ACETAMINOPHEN 650 MG RE SUPP: 650.0000 mg | Freq: Four times a day (QID) | RECTAL | Status: DC | PRN

## 2023-06-16 MED ORDER — PROCHLORPERAZINE EDISYLATE 10 MG/2ML IJ SOLN
5.0000 mg | Freq: Once | INTRAMUSCULAR | Status: AC
  Administered 2023-06-16: 5 mg via INTRAVENOUS
  Filled 2023-06-16: qty 2

## 2023-06-16 MED ORDER — METOCLOPRAMIDE HCL 5 MG/ML IJ SOLN
5.0000 mg | Freq: Four times a day (QID) | INTRAMUSCULAR | Status: DC | PRN
  Administered 2023-06-19: 5 mg via INTRAVENOUS
  Filled 2023-06-16 (×4): qty 2

## 2023-06-16 MED ORDER — FENTANYL CITRATE PF 50 MCG/ML IJ SOSY
12.5000 ug | PREFILLED_SYRINGE | INTRAMUSCULAR | Status: DC | PRN
  Administered 2023-06-17 – 2023-06-18 (×5): 50 ug via INTRAVENOUS
  Filled 2023-06-16 (×5): qty 1

## 2023-06-16 MED ORDER — LORAZEPAM 2 MG/ML IJ SOLN
1.0000 mg | Freq: Once | INTRAMUSCULAR | Status: AC
  Administered 2023-06-16: 1 mg via INTRAMUSCULAR
  Filled 2023-06-16: qty 1

## 2023-06-16 MED ORDER — POTASSIUM CHLORIDE CRYS ER 20 MEQ PO TBCR
40.0000 meq | EXTENDED_RELEASE_TABLET | Freq: Once | ORAL | Status: AC
  Administered 2023-06-16: 40 meq via ORAL
  Filled 2023-06-16: qty 2

## 2023-06-16 MED ORDER — METRONIDAZOLE 500 MG/100ML IV SOLN
500.0000 mg | Freq: Two times a day (BID) | INTRAVENOUS | Status: DC
  Administered 2023-06-17: 500 mg via INTRAVENOUS
  Filled 2023-06-16: qty 100

## 2023-06-16 MED ORDER — ACETAMINOPHEN 500 MG PO TABS
1000.0000 mg | ORAL_TABLET | Freq: Once | ORAL | Status: AC
  Administered 2023-06-16: 1000 mg via ORAL
  Filled 2023-06-16: qty 2

## 2023-06-16 MED ORDER — POTASSIUM CHLORIDE 10 MEQ/100ML IV SOLN
10.0000 meq | INTRAVENOUS | Status: AC
  Administered 2023-06-16 (×2): 10 meq via INTRAVENOUS
  Filled 2023-06-16 (×2): qty 100

## 2023-06-16 MED ORDER — IOHEXOL 300 MG/ML  SOLN
100.0000 mL | Freq: Once | INTRAMUSCULAR | Status: AC | PRN
  Administered 2023-06-16: 100 mL via INTRAVENOUS

## 2023-06-16 MED ORDER — ACETAMINOPHEN 325 MG PO TABS
650.0000 mg | ORAL_TABLET | Freq: Four times a day (QID) | ORAL | Status: DC | PRN
  Filled 2023-06-16: qty 2

## 2023-06-16 MED ORDER — VANCOMYCIN HCL IN DEXTROSE 1-5 GM/200ML-% IV SOLN
1000.0000 mg | Freq: Two times a day (BID) | INTRAVENOUS | Status: DC
  Administered 2023-06-17: 1000 mg via INTRAVENOUS
  Filled 2023-06-16: qty 200

## 2023-06-16 MED ORDER — AMLODIPINE BESYLATE 5 MG PO TABS
10.0000 mg | ORAL_TABLET | Freq: Once | ORAL | Status: AC
  Administered 2023-06-16: 10 mg via ORAL
  Filled 2023-06-16: qty 2

## 2023-06-16 MED ORDER — KETOROLAC TROMETHAMINE 30 MG/ML IJ SOLN
30.0000 mg | Freq: Once | INTRAMUSCULAR | Status: AC
  Administered 2023-06-16: 30 mg via INTRAVENOUS
  Filled 2023-06-16: qty 1

## 2023-06-16 MED ORDER — VANCOMYCIN HCL IN DEXTROSE 1-5 GM/200ML-% IV SOLN
1000.0000 mg | Freq: Once | INTRAVENOUS | Status: AC
  Administered 2023-06-16: 1000 mg via INTRAVENOUS
  Filled 2023-06-16: qty 200

## 2023-06-16 MED ORDER — SODIUM CHLORIDE 0.9 % IV SOLN
2.0000 g | Freq: Once | INTRAVENOUS | Status: AC
  Administered 2023-06-16: 2 g via INTRAVENOUS
  Filled 2023-06-16: qty 12.5

## 2023-06-16 MED ORDER — ONDANSETRON HCL 4 MG/2ML IJ SOLN
4.0000 mg | Freq: Once | INTRAMUSCULAR | Status: AC
  Administered 2023-06-16: 4 mg via INTRAVENOUS
  Filled 2023-06-16: qty 2

## 2023-06-16 MED ORDER — SODIUM CHLORIDE 0.9 % IV BOLUS
1000.0000 mL | Freq: Once | INTRAVENOUS | Status: AC
Start: 2023-06-16 — End: 2023-06-16
  Administered 2023-06-16: 1000 mL via INTRAVENOUS

## 2023-06-16 NOTE — Assessment & Plan Note (Signed)
Smokes 2-3 cigs a day  - Spoke about importance of quitting spent 5 minutes discussing options for treatment, prior attempts at quitting, and dangers of smoking  -At this point patient is   NOT  interested in quitting  - pt refused nicotine patch   - nursing tobacco cessation protocol

## 2023-06-16 NOTE — Subjective & Objective (Signed)
Patient came in with fever and fatigue has been vomiting states that she cannot keep anything down for the past 1 week reports at home febrile up to 102.3 she has chronic iron deficiency anemia and thinks she may have missed some iron infusions Has tried to control her fevers with Tylenol and ibuprofen Fever initially goes down but eventually goes back up No diarrhea No recent travel no sick contacts

## 2023-06-16 NOTE — Progress Notes (Signed)
Pharmacy Antibiotic Note  Cheryl Daniels is a 42 y.o. female admitted on 06/16/2023 with sepsis.  Pharmacy has been consulted for Vancomycin and Cefepime dosing.  Plan: Cefepime 2gm IV q8h Vancomycin 1000 mg IV Q 12 hrs. Goal AUC 400-600.  Expected AUC: 557.9  SCr used: 0.8 (rounded up from 0.7) Metronidazole per MD Follow renal function F/u culture results & sensitivities  Height: 5\' 3"  (160 cm) Weight: 72.6 kg (160 lb 0.9 oz) IBW/kg (Calculated) : 52.4  Temp (24hrs), Avg:98.4 F (36.9 C), Min:98.2 F (36.8 C), Max:98.5 F (36.9 C)  Recent Labs  Lab 06/16/23 1800 06/16/23 1801 06/16/23 1935 06/16/23 2210  WBC  --  2.9*  --   --   CREATININE 0.70 0.70  --   --   LATICACIDVEN 2.1*  --  1.0 <0.3*    Estimated Creatinine Clearance: 89.3 mL/min (by C-G formula based on SCr of 0.7 mg/dL).    No Known Allergies  Antimicrobials this admission: 11/24 Metronidazole >>   11/24 Cefepime >>   11/24 Vancomycin >>  Dose adjustments this admission:    Microbiology results: 11/24 BCx:   11/24 UCx:       Thank you for allowing pharmacy to be a part of this patient's care.  Maryellen Pile, PharmD 06/16/2023 10:16 PM

## 2023-06-16 NOTE — Assessment & Plan Note (Signed)
Will replace and recheck - will replace electrolytes and repeat  check Mg, phos and Ca level and replace as needed Monitor on telemetry   Lab Results  Component Value Date   K 3.1 (L) 06/16/2023     Lab Results  Component Value Date   CREATININE 0.70 06/16/2023   Lab Results  Component Value Date   MG 2.0 06/18/2021   Lab Results  Component Value Date   CALCIUM 9.0 06/16/2023

## 2023-06-16 NOTE — ED Triage Notes (Addendum)
Patient is here for evaluation of fever, elevated heart rate, generalized weakness, vomiting and unable to eat X 1 week. Reports high temperature of 102.3, has been taking tylenol and ibuprofen for it. Pt also reports she might have missed an iron infusion. Pt reports taking ibuprofen 400mg  about 3 hours ago.

## 2023-06-16 NOTE — ED Notes (Signed)
..ED TO INPATIENT HANDOFF REPORT  Name/Age/Gender Cheryl Daniels 42 y.o. female  Code Status Code Status History     Date Active Date Inactive Code Status Order ID Comments User Context   06/16/2021 2340 06/19/2021 1816 Full Code [id number]  Rolly Salter, MD ED   01/19/2015 1642 01/21/2015 1623 Full Code [id number]  Kathreen Cosier, MD Inpatient   01/19/2015 0728 01/19/2015 1642 Full Code [id number]  Laureen Abrahams, RN Inpatient       Home/SNF/Other Home  Chief Complaint Sepsis [facility]) [A41.9]  Level of Care/Admitting Diagnosis ED Disposition     ED Disposition  Admit   Condition  --   Comment  Hospital Area: [facility] [[id number]]  Level of Care: Progressive [102]  Admit to Progressive based on following criteria: CARDIOVASCULAR & THORACIC of moderate stability with acute coronary syndrome symptoms/low risk myocardial infarction/hypertensive urgency/arrhythmias/heart failure potentially compromising stability and stable post cardiovascular intervention patients.  May place patient in observation at [facility] or [facility] if equivalent level of care is available:: No  Covid Evaluation: Confirmed COVID Negative  Diagnosis: Sepsis [facility]) [[id number]]  Admitting Physician: Therisa Doyne [[id number]]  Attending Physician: Therisa Doyne [[id number]]          Medical History Past Medical History:  Diagnosis Date   Asthma    Back injuries    Hypertension     Allergies No Known Allergies  IV Location/Drains/Wounds Patient Lines/Drains/Airways Status     Active Line/Drains/Airways     Name Placement date Placement time Site Days   Peripheral IV 06/16/23 20 G Anterior;Right;Proximal Forearm 06/16/23  1807  Forearm  less than 1   Peripheral IV 06/16/23 20 G 1.88" Anterior;Proximal;Right;Upper Arm 06/16/23  2035  Arm  less than 1            Labs/Imaging Results for orders placed or performed during the hospital encounter of 06/16/23 (from  the past 48 hour(s))  Resp panel by RT-PCR (RSV, Flu A&B, Covid) Anterior Nasal Swab     Status: None   Collection Time: 06/16/23  5:30 PM   Specimen: Anterior Nasal Swab  Result Value Ref Range   SARS Coronavirus 2 by RT PCR NEGATIVE NEGATIVE    Comment: (NOTE) SARS-CoV-2 target nucleic acids are NOT DETECTED.  The SARS-CoV-2 RNA is generally detectable in upper respiratory specimens during the acute phase of infection. The lowest concentration of SARS-CoV-2 viral copies this assay can detect is 138 copies/mL. A negative result does not preclude SARS-Cov-2 infection and should not be used as the sole basis for treatment or other patient management decisions. A negative result may occur with  improper specimen collection/handling, submission of specimen other than nasopharyngeal swab, presence of viral mutation(s) within the areas targeted by this assay, and inadequate number of viral copies(<138 copies/mL). A negative result must be combined with clinical observations, patient history, and epidemiological information. The expected result is Negative.  Fact Sheet for Patients:  BloggerCourse.com  Fact Sheet for Healthcare Providers:  SeriousBroker.it  This test is no t yet approved or cleared by the [place] FDA and  has been authorized for detection and/or diagnosis of SARS-CoV-2 by FDA under an Emergency Use Authorization (EUA). This EUA will remain  in effect (meaning this test can be used) for the duration of the COVID-19 declaration under Section 564(b)(1) of the Act, 21 U.S.C.section 360bbb-3(b)(1), unless the authorization is terminated  or revoked sooner.       Influenza A by PCR NEGATIVE NEGATIVE  Influenza B by PCR NEGATIVE NEGATIVE    Comment: (NOTE) The Xpert Xpress SARS-CoV-2/FLU/RSV plus assay is intended as an aid in the diagnosis of influenza from Nasopharyngeal swab specimens and should not be used as a  sole basis for treatment. Nasal washings and aspirates are unacceptable for Xpert Xpress SARS-CoV-2/FLU/RSV testing.  Fact Sheet for Patients: BloggerCourse.com  Fact Sheet for Healthcare Providers: SeriousBroker.it  This test is not yet approved or cleared by the [place] FDA and has been authorized for detection and/or diagnosis of SARS-CoV-2 by FDA under an Emergency Use Authorization (EUA). This EUA will remain in effect (meaning this test can be used) for the duration of the COVID-19 declaration under Section 564(b)(1) of the Act, 21 U.S.C. section 360bbb-3(b)(1), unless the authorization is terminated or revoked.     Resp Syncytial Virus by PCR NEGATIVE NEGATIVE    Comment: (NOTE) Fact Sheet for Patients: BloggerCourse.com  Fact Sheet for Healthcare Providers: SeriousBroker.it  This test is not yet approved or cleared by the [place] FDA and has been authorized for detection and/or diagnosis of SARS-CoV-2 by FDA under an Emergency Use Authorization (EUA). This EUA will remain in effect (meaning this test can be used) for the duration of the COVID-19 declaration under Section 564(b)(1) of the Act, 21 U.S.C. section 360bbb-3(b)(1), unless the authorization is terminated or revoked.  Performed at [facility], [address], [place], [place] [address]   I-Stat Lactic Acid, ED     Status: Abnormal   Collection Time: 06/16/23  6:00 PM  Result Value Ref Range   Lactic Acid, Venous 2.1 (HH) 0.5 - 1.9 mmol/L   Comment NOTIFIED PHYSICIAN   I-stat chem 8, ED     Status: Abnormal   Collection Time: 06/16/23  6:00 PM  Result Value Ref Range   Sodium 134 (L) 135 - 145 mmol/L   Potassium 3.2 (L) 3.5 - 5.1 mmol/L   Chloride 101 98 - 111 mmol/L   BUN 10 6 - 20 mg/dL   Creatinine, Ser 9.62 0.44 - 1.00 mg/dL   Glucose, Bld 952 (H) 70 - 99 mg/dL     Comment: Glucose reference range applies only to samples taken after fasting for at least 8 hours.   Calcium, Ion 1.03 (L) 1.15 - 1.40 mmol/L   TCO2 26 22 - 32 mmol/L   Hemoglobin 13.9 12.0 - 15.0 g/dL   HCT 84.1 32.4 - 40.1 %  Comprehensive metabolic panel     Status: Abnormal   Collection Time: 06/16/23  6:01 PM  Result Value Ref Range   Sodium 133 (L) 135 - 145 mmol/L   Potassium 3.1 (L) 3.5 - 5.1 mmol/L   Chloride 98 98 - 111 mmol/L   CO2 21 (L) 22 - 32 mmol/L   Glucose, Bld 123 (H) 70 - 99 mg/dL    Comment: Glucose reference range applies only to samples taken after fasting for at least 8 hours.   BUN 11 6 - 20 mg/dL   Creatinine, Ser 0.27 0.44 - 1.00 mg/dL   Calcium 9.0 8.9 - 25.3 mg/dL   Total Protein 7.5 6.5 - 8.1 g/dL   Albumin 3.8 3.5 - 5.0 g/dL   AST 38 15 - 41 U/L   ALT 18 0 - 44 U/L   Alkaline Phosphatase 113 38 - 126 U/L   Total Bilirubin 0.5 <1.2 mg/dL   GFR, Estimated >66 >44 mL/min    Comment: (NOTE) Calculated using the CKD-EPI Creatinine Equation (2021)  Anion gap 14 5 - 15    Comment: Performed at [facility], [address], [place], [place] [address]  CBC with Differential     Status: Abnormal   Collection Time: 06/16/23  6:01 PM  Result Value Ref Range   WBC 2.9 (L) 4.0 - 10.5 K/uL   RBC 3.91 3.87 - 5.11 MIL/uL   Hemoglobin 12.1 12.0 - 15.0 g/dL   HCT 60.4 54.0 - 98.1 %   MCV 92.3 80.0 - 100.0 fL   MCH 30.9 26.0 - 34.0 pg   MCHC 33.5 30.0 - 36.0 g/dL   RDW 19.1 47.8 - 29.5 %   Platelets 414 (H) 150 - 400 K/uL   nRBC 1.4 (H) 0.0 - 0.2 %   Neutrophils Relative % 55 %   Neutro Abs 1.6 (L) 1.7 - 7.7 K/uL   Lymphocytes Relative 31 %   Lymphs Abs 0.9 0.7 - 4.0 K/uL   Monocytes Relative 7 %   Monocytes Absolute 0.2 0.1 - 1.0 K/uL   Eosinophils Relative 6 %   Eosinophils Absolute 0.2 0.0 - 0.5 K/uL   Basophils Relative 0 %   Basophils Absolute 0.0 0.0 - 0.1 K/uL   Immature Granulocytes 1 %   Abs Immature Granulocytes 0.03 0.00  - 0.07 K/uL    Comment: Performed at [facility], [address], [place], [place] [address]  hCG, serum, qualitative     Status: None   Collection Time: 06/16/23  6:01 PM  Result Value Ref Range   Preg, Serum NEGATIVE NEGATIVE    Comment:        THE SENSITIVITY OF THIS METHODOLOGY IS >10 mIU/mL. Performed at [facility], [address], [place], [place] [address]   Lipase, blood     Status: None   Collection Time: 06/16/23  6:01 PM  Result Value Ref Range   Lipase 26 11 - 51 U/L    Comment: Performed at [facility], [address], [place], [place] [address]  Troponin I (High Sensitivity)     Status: None   Collection Time: 06/16/23  6:01 PM  Result Value Ref Range   Troponin I (High Sensitivity) 3 <18 ng/L    Comment: (NOTE) Elevated high sensitivity troponin I (hsTnI) values and significant  changes across serial measurements may suggest ACS but many other  chronic and acute conditions are known to elevate hsTnI results.  Refer to the "Links" section for chest pain algorithms and additional  guidance. Performed at [facility], [address] [place], [place] [address]   Protime-INR     Status: None   Collection Time: 06/16/23  6:58 PM  Result Value Ref Range   Prothrombin Time 13.1 11.4 - 15.2 seconds   INR 1.0 0.8 - 1.2    Comment: (NOTE) INR goal varies based on device and disease states. Performed at [facility], [address], [place], [place] [address]   APTT     Status: None   Collection Time: 06/16/23  6:58 PM  Result Value Ref Range   aPTT 28 24 - 36 seconds    Comment: Performed at [facility], [address] [place], [place] [address]  Troponin I (High Sensitivity)     Status: None   Collection Time: 06/16/23  7:25 PM  Result Value Ref Range   Troponin I (High Sensitivity) 3 <18 ng/L    Comment: (NOTE) Elevated high sensitivity  troponin  I (hsTnI) values and significant  changes across serial measurements may suggest ACS but many other  chronic and acute conditions are known to elevate hsTnI results.  Refer to the "Links" section for chest pain algorithms and additional  guidance. Performed at [facility], [address], [place], [place] [address]   I-Stat Lactic Acid, ED     Status: None   Collection Time: 06/16/23  7:35 PM  Result Value Ref Range   Lactic Acid, Venous 1.0 0.5 - 1.9 mmol/L  Urinalysis, w/ Reflex to Culture (Infection Suspected) -Urine, Clean Catch     Status: Abnormal   Collection Time: 06/16/23  7:50 PM  Result Value Ref Range   Specimen Source URINE, CLEAN CATCH    Color, Urine AMBER (A) YELLOW    Comment: BIOCHEMICALS MAY BE AFFECTED BY COLOR   APPearance CLOUDY (A) CLEAR   Specific Gravity, Urine 1.026 1.005 - 1.030   pH 5.0 5.0 - 8.0   Glucose, UA NEGATIVE NEGATIVE mg/dL   Hgb urine dipstick SMALL (A) NEGATIVE   Bilirubin Urine SMALL (A) NEGATIVE   Ketones, ur NEGATIVE NEGATIVE mg/dL   Protein, ur 30 (A) NEGATIVE mg/dL   Nitrite NEGATIVE NEGATIVE   Leukocytes,Ua TRACE (A) NEGATIVE   RBC / HPF 0-5 0 - 5 RBC/hpf   WBC, UA 6-10 0 - 5 WBC/hpf    Comment:        Reflex urine culture not performed if WBC <=10, OR if Squamous epithelial cells >5. If Squamous epithelial cells >5 suggest recollection.    Bacteria, UA RARE (A) NONE SEEN   Squamous Epithelial / HPF 21-50 0 - 5 /HPF   Mucus PRESENT    Hyaline Casts, UA PRESENT     Comment: Performed at [facility], [address] [place], [place] [address]   DG Chest Port 1 View  Result Date: 06/16/2023 CLINICAL DATA:  Fever tachycardia EXAM: PORTABLE CHEST 1 VIEW COMPARISON:  06/16/2021 FINDINGS: The heart size and mediastinal contours are within normal limits. Both lungs are clear. The visualized skeletal structures are unremarkable. IMPRESSION: No active disease. Electronically Signed   By:  Jasmine Pang M.D.   On: 06/16/2023 18:42    Pending Labs Unresulted Labs (From admission, onward)     Start     Ordered   06/17/23 0500  Vitamin B12  (Anemia Panel (PNL))  Tomorrow morning,   R        06/16/23 2113   06/17/23 0500  Folate  (Anemia Panel (PNL))  Tomorrow morning,   R        06/16/23 2113   06/17/23 0500  Prealbumin  Tomorrow morning,   R        06/16/23 2113   06/16/23 2114  Iron and TIBC  (Anemia Panel (PNL))  Add-on,   AD        06/16/23 2113   06/16/23 2114  Ferritin  (Anemia Panel (PNL))  Add-on,   AD        06/16/23 2113   06/16/23 2114  Reticulocytes  (Anemia Panel (PNL))  Add-on,   AD        06/16/23 2113   06/16/23 2114  Rapid urine drug screen (hospital performed)  ONCE - STAT,   STAT        06/16/23 2113   06/16/23 2114  Ethanol  Add-on,   AD        06/16/23 2113   06/16/23 2114  Procalcitonin  Add-on,  AD       References:    Procalcitonin Lower Respiratory Tract Infection AND Sepsis Procalcitonin Algorithm   06/16/23 2113   06/16/23 2114  Respiratory (~20 pathogens) panel by PCR  (Respiratory panel by PCR (~20 pathogens, ~24 hr TAT)  w precautions)  Once,   URGENT        06/16/23 2113   06/16/23 2114  TSH  Add-on,   AD        06/16/23 2113   06/16/23 2114  CK  Add-on,   AD        06/16/23 2113   06/16/23 2114  Magnesium  Add-on,   AD        06/16/23 2113   06/16/23 2114  Phosphorus  Add-on,   AD        06/16/23 2113   06/16/23 2044  Urine Culture  Once,   URGENT       Question:  Indication  Answer:  Sepsis   06/16/23 2046   06/16/23 1717  Blood Culture (routine x 2)  (Undifferentiated presentation (screening labs and basic nursing orders))  BLOOD CULTURE X 2,   STAT      06/16/23 1717            Vitals/Pain Today's Vitals   06/16/23 2100 06/16/23 2115 06/16/23 2130 06/16/23 2145  BP: (!) 138/104 (!) 143/94 (!) 132/108 (!) 128/98  Pulse: (!) 109 (!) 104 (!) 113 (!) 106  Resp: 17 (!) 21 17 19   Temp:      TempSrc:      SpO2: 100% 100%  100% 100%  Weight:      Height:      PainSc:        Isolation Precautions Droplet precaution  Medications Medications  potassium chloride 10 mEq in 100 mL IVPB (10 mEq Intravenous New Bag/Given 06/16/23 2138)  iohexol (OMNIPAQUE) 300 MG/ML solution 100 mL (has no administration in time range)  sodium chloride 0.9 % bolus 1,000 mL (0 mLs Intravenous Stopped 06/16/23 2115)  ondansetron (ZOFRAN) injection 4 mg (4 mg Intravenous Given 06/16/23 1812)  potassium chloride SA (KLOR-CON M) CR tablet 40 mEq (40 mEq Oral Given 06/16/23 1856)  lactated ringers bolus 1,000 mL (0 mLs Intravenous Stopped 06/16/23 2115)  acetaminophen (TYLENOL) tablet 1,000 mg (1,000 mg Oral Given 06/16/23 1856)  amLODipine (NORVASC) tablet 10 mg (10 mg Oral Given 06/16/23 1856)  prochlorperazine (COMPAZINE) injection 5 mg (5 mg Intravenous Given 06/16/23 1855)  ceFEPIme (MAXIPIME) 2 g in sodium chloride 0.9 % 100 mL IVPB (0 g Intravenous Stopped 06/16/23 2029)  metroNIDAZOLE (FLAGYL) IVPB 500 mg (0 mg Intravenous Stopped 06/16/23 2133)  vancomycin (VANCOCIN) IVPB 1000 mg/200 mL premix (1,000 mg Intravenous New Bag/Given 06/16/23 2043)  LORazepam (ATIVAN) injection 1 mg (1 mg Intramuscular Given 06/16/23 2135)  ketorolac (TORADOL) 30 MG/ML injection 30 mg (30 mg Intravenous Given 06/16/23 2134)    Mobility walks

## 2023-06-16 NOTE — H&P (Signed)
Cheryl Daniels YNW:[id number] DOB: 03-05-1981 DOA: 06/16/2023     PCP: Murlean Caller, MD   Outpatient Specialists:      Oncology  in [place]  Patient arrived to ER on 06/16/23 at 1702 Referred by Attending Wynetta Fines, MD   Patient coming from:    home Lives  With family    Chief Complaint:   Chief Complaint  Patient presents with   Fever   Weakness    HPI: Cheryl Daniels is a 42 y.o. female with medical history significant of HTN. Iron def anemia,       Presented with  fever Patient came in with fever and fatigue has been vomiting states that she cannot keep anything down for the past 1 week reports at home febrile up to 102.3 she has chronic iron deficiency anemia and thinks she may have missed some iron infusions Has tried to control her fevers with Tylenol and ibuprofen Fever initially goes down but eventually goes back up No diarrhea No recent travel no sick contacts   Reports muscle aches and low back pain  Has a young child with a mild cough She works form home  Last ETOH was last Tuesday  ETOH intake  2  wines a week Does smoke not interested in quitting   Lab Results  Component Value Date   SARSCOV2NAA NEGATIVE 06/16/2023   SARSCOV2NAA NEGATIVE 06/16/2021        Regarding pertinent Chronic problems:     HTN on NOrvasc       Chronic iron def anemia - baseline hg Hemoglobin & Hematocrit  Recent Labs    06/16/23 1800 06/16/23 1801  HGB 13.9 12.1   Iron/TIBC/Ferritin/ %Sat    Component Value Date/Time   IRON 385 (H) 06/16/2021 2027   TIBC 424 06/16/2021 2027   FERRITIN 430 (H) 06/16/2021 2027   IRONPCTSAT 91 (H) 06/16/2021 2027    While in ER: Clinical Course as of 06/16/23 2106  Sun Jun 16, 2023  1834 Sodium(!): 133 [AH]  1835 Potassium(!): 3.1 [AH]  1835 Glucose(!): 123 [AH]  1836 Lactic Acid, Venous(!!): 2.1 [AH]  1836 Hemoglobin: 13.9 [AH]  1836 Resp panel by RT-PCR (RSV, Flu A&B, Covid) Anterior Nasal Swab Negative  [AH]   1839 Patient now complaining a headache [AH]  1933 Patient now meeting sepsis criteria with white blood cell count of 2.9.  Initially my concern with her vomiting was that her tachycardia may be secondary to her volume loss.  Patient is already got blood cultures.  Have added broad-spectrum antibiotics.  Currently do not have a source.  She is receiving IV fluid resuscitation. [AH]    Clinical Course User Index [AH] Arthor Captain, PA-C       Lab Orders         Blood Culture (routine x 2)         Resp panel by RT-PCR (RSV, Flu A&B, Covid) Anterior Nasal Swab         Urine Culture         Comprehensive metabolic panel         CBC with Differential         hCG, serum, qualitative         Lipase, blood         Urinalysis, w/ Reflex to Culture (Infection Suspected) -Urine, Clean Catch         Protime-INR         APTT  I-Stat Lactic Acid, ED         I-stat chem 8, ED      CXR -  NON acute  CTabd/pelvis - ordered   Following Medications were ordered in ER: Medications  metroNIDAZOLE (FLAGYL) IVPB 500 mg (500 mg Intravenous New Bag/Given 06/16/23 2029)  vancomycin (VANCOCIN) IVPB 1000 mg/200 mL premix (1,000 mg Intravenous New Bag/Given 06/16/23 2043)  sodium chloride 0.9 % bolus 1,000 mL (1,000 mLs Intravenous New Bag/Given 06/16/23 1807)  ondansetron (ZOFRAN) injection 4 mg (4 mg Intravenous Given 06/16/23 1812)  potassium chloride SA (KLOR-CON M) CR tablet 40 mEq (40 mEq Oral Given 06/16/23 1856)  lactated ringers bolus 1,000 mL (1,000 mLs Intravenous New Bag/Given 06/16/23 1919)  acetaminophen (TYLENOL) tablet 1,000 mg (1,000 mg Oral Given 06/16/23 1856)  amLODipine (NORVASC) tablet 10 mg (10 mg Oral Given 06/16/23 1856)  prochlorperazine (COMPAZINE) injection 5 mg (5 mg Intravenous Given 06/16/23 1855)  ceFEPIme (MAXIPIME) 2 g in sodium chloride 0.9 % 100 mL IVPB (0 g Intravenous Stopped 06/16/23 2029)    __  ED Triage Vitals  Encounter Vitals Group     BP 06/16/23  1709 (!) 168/122     Systolic BP Percentile --      Diastolic BP Percentile --      Pulse Rate 06/16/23 1709 (!) 142     Resp 06/16/23 1709 18     Temp 06/16/23 1709 98.2 F (36.8 C)     Temp Source 06/16/23 1709 Oral     SpO2 06/16/23 1709 98 %     Weight 06/16/23 1710 160 lb 0.9 oz (72.6 kg)     Height 06/16/23 1710 5\' 3"  (1.6 m)     Head Circumference --      Peak Flow --      Pain Score 06/16/23 1709 7     Pain Loc --      Pain Education --      Exclude from Growth Chart --   UJWJ(19)@     _________________________________________ Significant initial  Findings: Abnormal Labs Reviewed  COMPREHENSIVE METABOLIC PANEL - Abnormal; Notable for the following components:      Result Value   Sodium 133 (*)    Potassium 3.1 (*)    CO2 21 (*)    Glucose, Bld 123 (*)    All other components within normal limits  CBC WITH DIFFERENTIAL/PLATELET - Abnormal; Notable for the following components:   WBC 2.9 (*)    Platelets 414 (*)    nRBC 1.4 (*)    Neutro Abs 1.6 (*)    All other components within normal limits  URINALYSIS, W/ REFLEX TO CULTURE (INFECTION SUSPECTED) - Abnormal; Notable for the following components:   Color, Urine AMBER (*)    APPearance CLOUDY (*)    Hgb urine dipstick SMALL (*)    Bilirubin Urine SMALL (*)    Protein, ur 30 (*)    Leukocytes,Ua TRACE (*)    Bacteria, UA RARE (*)    All other components within normal limits  I-STAT CG4 LACTIC ACID, ED - Abnormal; Notable for the following components:   Lactic Acid, Venous 2.1 (*)    All other components within normal limits  I-STAT CHEM 8, ED - Abnormal; Notable for the following components:   Sodium 134 (*)    Potassium 3.2 (*)    Glucose, Bld 121 (*)    Calcium, Ion 1.03 (*)    All other components within normal limits   __________________ Troponin  ordered Cardiac Panel (last 3 results) Recent Labs    06/16/23 1801 06/16/23 1925  TROPONINIHS 3 3    ECG: Ordered Personally reviewed and interpreted  by me showing: HR : 139 Rhythm: Sinus tachycardia Consider right atrial enlargement Borderline right axis deviation Prolonged QT  QTC 600   Lab Results  Component Value Date   SARSCOV2NAA NEGATIVE 06/16/2023   SARSCOV2NAA NEGATIVE 06/16/2021    ____________________ This patient meets SIRS Criteria and may be septic.   The recent clinical data is shown below. Vitals:   06/16/23 1709 06/16/23 1710 06/16/23 1834 06/16/23 1900  BP: (!) 168/122  (!) 141/108 (!) 145/110  Pulse: (!) 142  (!) 107 (!) 112  Resp: 18  (!) 21 16  Temp: 98.2 F (36.8 C)  98.5 F (36.9 C)   TempSrc: Oral     SpO2: 98%  100% 100%  Weight:  72.6 kg    Height:  5\' 3"  (1.6 m)      WBC     Component Value Date/Time   WBC 2.9 (L) 06/16/2023 1801   LYMPHSABS 0.9 06/16/2023 1801   LYMPHSABS 2.0 08/21/2018 1500   MONOABS 0.2 06/16/2023 1801   EOSABS 0.2 06/16/2023 1801   EOSABS 0.2 08/21/2018 1500   BASOSABS 0.0 06/16/2023 1801   BASOSABS 0.0 08/21/2018 1500     Lactic Acid, Venous    Component Value Date/Time   LATICACIDVEN 1.0 06/16/2023 1935    Procalcitonin   Ordered      UA  evidence of UTI     Urine analysis:    Component Value Date/Time   COLORURINE AMBER (A) 06/16/2023 1950   APPEARANCEUR CLOUDY (A) 06/16/2023 1950   LABSPEC 1.026 06/16/2023 1950   PHURINE 5.0 06/16/2023 1950   GLUCOSEU NEGATIVE 06/16/2023 1950   HGBUR SMALL (A) 06/16/2023 1950   BILIRUBINUR SMALL (A) 06/16/2023 1950   KETONESUR NEGATIVE 06/16/2023 1950   PROTEINUR 30 (A) 06/16/2023 1950   UROBILINOGEN 0.2 09/13/2009 1802   NITRITE NEGATIVE 06/16/2023 1950   LEUKOCYTESUR TRACE (A) 06/16/2023 1950    Results for orders placed or performed during the hospital encounter of 06/16/23  Resp panel by RT-PCR (RSV, Flu A&B, Covid) Anterior Nasal Swab     Status: None   Collection Time: 06/16/23  5:30 PM   Specimen: Anterior Nasal Swab  Result Value Ref Range Status   SARS Coronavirus 2 by RT PCR NEGATIVE NEGATIVE Final          Influenza A by PCR NEGATIVE NEGATIVE Final   Influenza B by PCR NEGATIVE NEGATIVE Final         Resp Syncytial Virus by PCR NEGATIVE NEGATIVE Final          ABX started Antibiotics Given (last 72 hours)     Date/Time Action Medication Dose Rate   06/16/23 2001 New Bag/Given   ceFEPIme (MAXIPIME) 2 g in sodium chloride 0.9 % 100 mL IVPB 2 g 200 mL/hr   06/16/23 2029 New Bag/Given   metroNIDAZOLE (FLAGYL) IVPB 500 mg 500 mg 100 mL/hr   06/16/23 2043 New Bag/Given   vancomycin (VANCOCIN) IVPB 1000 mg/200 mL premix 1,000 mg 200 mL/hr        __________________________________________________________ Recent Labs  Lab 06/16/23 1800 06/16/23 1801  NA 134* 133*  K 3.2* 3.1*  CO2  --  21*  GLUCOSE 121* 123*  BUN 10 11  CREATININE 0.70 0.70  CALCIUM  --  9.0    Cr  stable,   Lab  Results  Component Value Date   CREATININE 0.70 06/16/2023   CREATININE 0.70 06/16/2023   CREATININE 0.53 06/19/2021    Recent Labs  Lab 06/16/23 1801  AST 38  ALT 18  ALKPHOS 113  BILITOT 0.5  PROT 7.5  ALBUMIN 3.8   Lab Results  Component Value Date   CALCIUM 9.0 06/16/2023    Plt: Lab Results  Component Value Date   PLT 414 (H) 06/16/2023       Recent Labs  Lab 06/16/23 1800 06/16/23 1801  WBC  --  2.9*  NEUTROABS  --  1.6*  HGB 13.9 12.1  HCT 41.0 36.1  MCV  --  92.3  PLT  --  414*    HG/HCT  stable,       Component Value Date/Time   HGB 12.1 06/16/2023 1801   HGB 10.9 (L) 08/21/2018 1500   HCT 36.1 06/16/2023 1801   HCT 34.7 08/21/2018 1500   MCV 92.3 06/16/2023 1801   MCV 78 (L) 08/21/2018 1500     Recent Labs  Lab 06/16/23 1801  LIPASE 26   No results for input(s): "AMMONIA" in the last 168 hours.    _______________________________________________ Hospitalist was called for admission for   Acute cystitis with hematuria  sepsis Leukopenia    The following Work up has been ordered so far:  Orders Placed This Encounter  Procedures   Blood  Culture (routine x 2)   Resp panel by RT-PCR (RSV, Flu A&B, Covid) Anterior Nasal Swab   Urine Culture   DG Chest Port 1 View   Comprehensive metabolic panel   CBC with Differential   hCG, serum, qualitative   Lipase, blood   Urinalysis, w/ Reflex to Culture (Infection Suspected) -Urine, Clean Catch   Protime-INR   APTT   Diet NPO time specified   Document height and weight   Assess and Document Glasgow Coma Scale   Document vital signs within 1-hour of fluid bolus completion.  Notify provider of abnormal vital signs despite fluid resuscitation.   Refer to Sidebar Report: Sepsis Bundle ED/IP   Notify provider for difficulties obtaining IV access   Initiate Carrier Fluid Protocol   DO NOT delay antibiotics if unable to obtain blood culture.   Code Sepsis activation.  This occurs automatically when order is signed and prioritizes pharmacy, lab, and radiology services for STAT collections and interventions.  If [facility] downtime, call [facility] [phone]) to activate Code Sepsis.   monitoring by pharmacy   Consult to hospitalist   I-Stat Lactic Acid, ED   I-stat chem 8, ED   EKG 12-Lead   ED EKG 12-Lead   Insert peripheral IV X 1   Insert 2nd peripheral IV if not already present.     OTHER Significant initial  Findings:  labs showing:     DM  labs:  HbA1C: No results for input(s): "HGBA1C" in the last 8760 hours.     CBG (last 3)  No results for input(s): "GLUCAP" in the last 72 hours.        Cultures: No results found for: "SDES", "SPECREQUEST", "CULT", "REPTSTATUS"   Radiological Exams on Admission: DG Chest Port 1 View  Result Date: 06/16/2023 CLINICAL DATA:  Fever tachycardia EXAM: PORTABLE CHEST 1 VIEW COMPARISON:  06/16/2021 FINDINGS: The heart size and mediastinal contours are within normal limits. Both lungs are clear. The visualized skeletal structures are unremarkable. IMPRESSION: No active disease. Electronically Signed   By: Jasmine Pang M.D.   On:  06/16/2023  18:42   _______________________________________________________________________________________________________ Latest  Blood pressure (!) 145/110, pulse (!) 112, temperature 98.5 F (36.9 C), resp. rate 16, height 5\' 3"  (1.6 m), weight 72.6 kg, last menstrual period 06/11/2023, SpO2 100%, unknown if currently breastfeeding.   Vitals  labs and radiology finding personally reviewed  Review of Systems:    Pertinent positives include:  Fevers, chills, fatigue,   Constitutional:  No weight loss, night sweats, weight loss  HEENT:  No headaches, Difficulty swallowing,Tooth/dental problems,Sore throat,  No sneezing, itching, ear ache, nasal congestion, post nasal drip,  Cardio-vascular:  No chest pain, Orthopnea, PND, anasarca, dizziness, palpitations.no Bilateral lower extremity swelling  GI:  No heartburn, indigestion, abdominal pain, nausea, vomiting, diarrhea, change in bowel habits, loss of appetite, melena, blood in stool, hematemesis Resp:  no shortness of breath at rest. No dyspnea on exertion, No excess mucus, no productive cough, No non-productive cough, No coughing up of blood.No change in color of mucus.No wheezing. Skin:  no rash or lesions. No jaundice GU:  no dysuria, change in color of urine, no urgency or frequency. No straining to urinate.  No flank pain.  Musculoskeletal:  No joint pain or no joint swelling. No decreased range of motion. No back pain.  Psych:  No change in mood or affect. No depression or anxiety. No memory loss.  Neuro: no localizing neurological complaints, no tingling, no weakness, no double vision, no gait abnormality, no slurred speech, no confusion  All systems reviewed and apart from HOPI all are negative _______________________________________________________________________________________________ Past Medical History:   Past Medical History:  Diagnosis Date   Asthma    Back injuries    Hypertension     Past Surgical History:   Procedure Laterality Date   COLONOSCOPY WITH PROPOFOL Left 06/18/2021   Procedure: COLONOSCOPY WITH PROPOFOL;  Surgeon: Willis Modena, MD;  Location: [facility];  Service: Endoscopy;  Laterality: Left;   ESOPHAGOGASTRODUODENOSCOPY (EGD) WITH PROPOFOL N/A 06/18/2021   Procedure: ESOPHAGOGASTRODUODENOSCOPY (EGD) WITH PROPOFOL;  Surgeon: Willis Modena, MD;  Location: [facility];  Service: Endoscopy;  Laterality: N/A;   NO PAST SURGERIES      Social History:  Ambulatory   independently      reports that she quit smoking about 9 years ago. She started smoking about 19 years ago. She has a 5 pack-year smoking history. She has never used smokeless tobacco. She reports that she does not currently use drugs after having used the following drugs: Marijuana. She reports that she does not drink alcohol.    Family History:   Family History  Problem Relation Age of Onset   Hypertension Father    ______________________________________________________________________________________________ Allergies: No Known Allergies   Prior to Admission medications   Medication Sig Start Date End Date Taking? Authorizing Provider  amLODipine (NORVASC) 10 MG tablet Take 1 tablet (10 mg total) by mouth daily. 06/19/21 06/16/23 Yes [place], Eric J, DO  diphenhydrAMINE (BENADRYL) 25 MG tablet Take 25 mg by mouth at bedtime as needed for allergies.   Yes [provider]  ibuprofen (ADVIL) 200 MG tablet Take 200 mg by mouth every 6 (six) hours as needed for mild pain (pain score 1-3) or fever.   Yes [provider]    ___________________________________________________________________________________________________ Physical Exam:    06/16/2023    7:00 PM 06/16/2023    6:34 PM 06/16/2023    5:10 PM  Vitals with BMI  Height   5\' 3"   Weight   160 lbs 1 oz  BMI   28.36  Systolic  145 141   Diastolic 110 108   Pulse 112 107     1. General:  in No  Acute distress   Chronically  ill-appearing 2. Psychological: Alert and  Oriented 3. Head/ENT:    Dry Mucous Membranes                          Head Non traumatic, neck supple                          Poor Dentition 4. SKIN:  decreased Skin turgor,  Skin clean Dry and intact no rash    5. Heart: Regular rate and rhythm no Murmur, no Rub or gallop 6. Lungs:  no wheezes or crackles   7. Abdomen: Soft,   suprapubic tender, Non distended  bowel sounds present 8. Lower extremities: no clubbing, cyanosis, no  edema 9. Neurologically Grossly intact, moving all 4 extremities equally   10. MSK: Normal range of motion    Chart has been reviewed  ______________________________________________________________________________________________  Assessment/Plan  42 y.o. female with medical history significant of HTN. Iron def anemia Admitted for Sepsis likely due to UTi   Present on Admission:  Prolonged QT interval  Sepsis (HCC)  Alcohol use  Iron deficiency anemia due to chronic blood loss  UTI (urinary tract infection)  Hypokalemia  Leukopenia  Tobacco abuse     Prolonged QT interval - will monitor on tele avoid QT prolonging medications, rehydrate correct electrolytes   Alcohol use Spoke about need to cut back Family endorses that patient at times looks more pale when she has been drinking Patient denies drinking every day denies history of DTs  Iron deficiency anemia due to chronic blood loss Chronic stable patient is to continue to follow-up as an outpatient for iron infusions check iron levels  Sepsis (HCC)  -SIRS criteria met with low white blood cell count,       Component Value Date/Time   WBC 2.9 (L) 06/16/2023 1801   LYMPHSABS 0.9 06/16/2023 1801   LYMPHSABS 2.0 08/21/2018 1500     tachycardia   ,   fever   RR >20 Today's Vitals   06/16/23 2100 06/16/23 2115 06/16/23 2130 06/16/23 2145  BP: (!) 138/104 (!) 143/94 (!) 132/108 (!) 128/98  Pulse: (!) 109 (!) 104 (!) 113 (!) 106  Resp: 17 (!)  21 17 19   Temp:      TempSrc:      SpO2: 100% 100% 100% 100%  Weight:      Height:      PainSc:         -Most likely source being:  urinary vs Viral        - Obtain serial lactic acid and procalcitonin level.  - Initiated IV antibiotics in ER: Antibiotics Given (last 72 hours)     Date/Time Action Medication Dose Rate   06/16/23 2001 New Bag/Given   ceFEPIme (MAXIPIME) 2 g in sodium chloride 0.9 % 100 mL IVPB 2 g 200 mL/hr   06/16/23 2029 New Bag/Given   metroNIDAZOLE (FLAGYL) IVPB 500 mg 500 mg 100 mL/hr   06/16/23 2043 New Bag/Given   vancomycin (VANCOCIN) IVPB 1000 mg/200 mL premix 1,000 mg 200 mL/hr       Will continue  on : Broad-spectrum antibiotics for now until source determined UA is a strong possibility but await results of CT abdomen   - await results of blood and urine  culture  - Rehydrate aggressively  Intravenous fluids were administered,    30cc/kg fluid  Given possibility of viral illness will obtain respiratory panel and check procalcitonin   10:11 PM   UTI (urinary tract infection) Questionable evidence of UTI with suprapubic tenderness.  CT abdomen pelvis pending to evaluate for any further intra-abdominal infectious sources For right now should be able to cover cefepime Await results of urine culture  Hypokalemia Will replace and recheck - will replace electrolytes and repeat  check Mg, phos and Ca level and replace as needed Monitor on telemetry   Lab Results  Component Value Date   K 3.1 (L) 06/16/2023     Lab Results  Component Value Date   CREATININE 0.70 06/16/2023   Lab Results  Component Value Date   MG 2.0 06/18/2021   Lab Results  Component Value Date   CALCIUM 9.0 06/16/2023     Leukopenia Could be in the setting of heavy alcohol use although patient denies daily drinking.  Could be also in the setting of acute illness patient does have ongoing care with hematology oncology.  If persists may need further  evaluation  Tobacco abuse Smokes 2-3 cigs a day  - Spoke about importance of quitting spent 5 minutes discussing options for treatment, prior attempts at quitting, and dangers of smoking  -At this point patient is   NOT  interested in quitting  - pt refused nicotine patch   - nursing tobacco cessation protocol    Other plan as per orders.  DVT prophylaxis:  SCD       Code Status:    Code Status: Prior FULL CODE   patient   I had personally discussed CODE STATUS with patient and family  ACP   none  Family Communication:   Family   at  Bedside  plan of care was discussed  Husband,  Diet  Diet Orders (From admission, onward)     Start     Ordered   06/16/23 1717  Diet NPO time specified  (Undifferentiated presentation (screening labs and basic nursing orders))  Diet effective now        06/16/23 1717            Disposition Plan:      To home once workup is complete and patient is stable   Following barriers for discharge:                                                      Electrolytes corrected                                                             Afebrile, white count improving able to transition to PO antibiotics                                   Consult Orders  (From admission, onward)           Start     Ordered   06/16/23 2047  Consult to hospitalist  Once  Provider:  (Not yet assigned)  Question Answer Comment  Place call to: [facility]   Reason for Consult Admit      06/16/23 2046              Consults called: none   Admission status:  ED Disposition     ED Disposition  Admit   Condition  --   Comment  The patient appears reasonably stabilized for admission considering the current resources, flow, and capabilities available in the ED at this time, and I doubt any other [facility] requiring further screening and/or treatment in the ED prior to admission is  present.           Obs    Level of care      progressive      stepdown   tele indefinitely please discontinue once patient no longer qualifies COVID-19 Labs    Lab Results  Component Value Date   SARSCOV2NAA NEGATIVE 06/16/2023     Precautions: admitted as   Covid Negative     Blayre Papania 06/16/2023, 10:18 PM    [facility]     after 2 AM please page floor coverage PA If 7AM-7PM, please contact the day team taking care of the patient using [email or web address]

## 2023-06-16 NOTE — ED Provider Notes (Signed)
[place] EMERGENC[facility] DEPARTMENT AT [facility] Provider Note   CSN: [id number] Arrival date & time: 06/16/23  1702     History  Chief Complaint  Patient presents with   Fever   Weakness    Cheryl Daniels is a 42 y.o. female emergency department with vomiting and fever.  Patient reports that she has been vomiting for about 1 week.  She reports that she is really not been able to hold anything down since Thursday.  She has been running fevers with a Tmax of 102 F..  Patient states that her fever will come down with antipyretics but does have return of her fever pretty much every time.  She denies abdominal pain or diarrhea.  She has a history of iron deficiency anemia and has had iron transfusion.  She missed her last scheduled infusion.  She denies recent travel, ingestion of suspicious foods and has no contacts with similar symptoms.  She denies urinary symptoms.   Fever Weakness Associated symptoms: fever        Home Medications Prior to Admission medications   Medication Sig Start Date End Date Taking? Authorizing Provider  amLODipine (NORVASC) 10 MG tablet Take 1 tablet (10 mg total) by mouth daily. 06/19/21 09/17/21  [place], Alvira Philips, DO  pantoprazole (PROTONIX) 40 MG tablet Take 1 tablet (40 mg total) by mouth daily. 06/19/21 09/17/21  [place], Eric J, DO      Allergies    Patient has no known allergies.    Review of Systems   Review of Systems  Constitutional:  Positive for fever.  Neurological:  Positive for weakness.    Physical Exam Updated Vital Signs BP (!) 168/122 (BP Location: Right Arm)   Pulse (!) 142   Temp 98.2 F (36.8 C) (Oral)   Resp 18   Ht 5\' 3"  (1.6 m)   Wt 72.6 kg   LMP 06/11/2023   SpO2 98%   BMI 28.35 kg/m  Physical Exam Vitals and nursing note reviewed.  Constitutional:      General: She is not in acute distress.    Appearance: She is well-developed. She is not diaphoretic.  HENT:     Head: Normocephalic and atraumatic.      Right Ear: External ear normal.     Left Ear: External ear normal.     Nose: Nose normal.     Mouth/Throat:     Mouth: Mucous membranes are moist.  Eyes:     General: No scleral icterus.    Conjunctiva/sclera: Conjunctivae normal.  Cardiovascular:     Rate and Rhythm: Normal rate and regular rhythm.     Heart sounds: Normal heart sounds. No murmur heard.    No friction rub. No gallop.  Pulmonary:     Effort: Pulmonary effort is normal. No respiratory distress.     Breath sounds: Normal breath sounds.  Abdominal:     General: Bowel sounds are normal. There is no distension.     Palpations: Abdomen is soft. There is no mass.     Tenderness: There is no abdominal tenderness. There is no guarding.  Musculoskeletal:     Cervical back: Normal range of motion.  Skin:    General: Skin is warm and dry.  Neurological:     Mental Status: She is alert and oriented to person, place, and time.  Psychiatric:        Behavior: Behavior normal.     ED Results / Procedures / Treatments   Labs (all  labs ordered are listed, but only abnormal results are displayed) Labs Reviewed  CULTURE, BLOOD (ROUTINE X 2)  CULTURE, BLOOD (ROUTINE X 2)  RESP PANEL BY RT-PCR (RSV, FLU A&B, COVID)  [id number]  COMPREHENSIVE METABOLIC PANEL  CBC WITH DIFFERENTIAL/PLATELET  PROTIME-INR  APTT  HCG, SERUM, QUALITATIVE  LIPASE, BLOOD  URINALYSIS, W/ REFLEX TO CULTURE (INFECTION SUSPECTED)  I-STAT CG4 LACTIC ACID, ED  I-STAT CHEM 8, ED  TROPONIN I (HIGH SENSITIVITY)    EKG EKG Interpretation Date/Time:  Sunday June 16 2023 17:13:06 EST Ventricular Rate:  139 PR Interval:  132 QRS Duration:  93 QT Interval:  394 QTC Calculation: 600 R Axis:   92  Text Interpretation: Sinus tachycardia Consider right atrial enlargement Borderline right axis deviation Prolonged QT interval Baseline wander in lead(s) II aVR Confirmed by Kristine Royal [phone]) on 06/16/2023 5:30:44 PM  Radiology No results  found.  Procedures .Critical Care  Performed by: Arthor Captain, PA-C Authorized by: Arthor Captain, PA-C   Critical care provider statement:    Critical care time (minutes):  60   Critical care time was exclusive of:  Separately billable procedures and treating other patients and teaching time   Critical care was necessary to treat or prevent imminent or life-threatening deterioration of the following conditions:  Sepsis   Critical care was time spent personally by me on the following activities:  Development of treatment plan with patient or surrogate, discussions with consultants, evaluation of patient's response to treatment, examination of patient, ordering and review of laboratory studies, ordering and review of radiographic studies, ordering and performing treatments and interventions, pulse oximetry, re-evaluation of patient's condition and review of old charts     Medications Ordered in ED Medications  sodium chloride 0.9 % bolus 1,000 mL (has no administration in time range)  ondansetron (ZOFRAN) injection 4 mg (has no administration in time range)    ED Course/ Medical Decision Making/ A&P Clinical Course as of 06/16/23 2203  Sun Jun 16, 2023  1834 Sodium(!): 133 [AH]  1835 Potassium(!): 3.1 [AH]  1835 Glucose(!): 123 [AH]  1836 Lactic Acid, Venous(!!): 2.1 [AH]  1836 Hemoglobin: 13.9 [AH]  1836 Resp panel by RT-PCR (RSV, Flu A&B, Covid) Anterior Nasal Swab Negative  [AH]  1839 Patient now complaining a headache [AH]  1933 Patient now meeting sepsis criteria with white blood cell count of 2.9.  Initially my concern with her vomiting was that her tachycardia may be secondary to her volume loss.  Patient is already got blood cultures.  Have added broad-spectrum antibiotics.  Currently do not have a source.  She is receiving IV fluid resuscitation. [AH]    Clinical Course User Index [AH] Arthor Captain, PA-C                                   Medical Decision  Making This patient presents to the ED for concern of vomiting and fever, this involves an extensive number of treatment options, and is a complaint that carries with it a high risk of complications and morbidity.  The emergent differential diagnosis for vomiting includes, but is not limited to ACS/MI, DKA, Ischemic bowel, Meningitis, Sepsis, Acute gastric dilation, Adrenal insufficiency, Appendicitis,  Bowel obstruction/ileus, Carbon monoxide poisoning, Cholecystitis, Electrolyte abnormalities, Elevated ICP, Gastric outlet obstruction, Pancreatitis, Ruptured viscus, Biliary colic, Cannabinoid hyperemesis syndrome, Gastritis, Gastroenteritis, Gastroparesis,  Narcotic withdrawal, Peptic ulcer disease, and UTI   Co morbidities:  Past Medical History: No date: Asthma No date: Back injuries No date: Hypertension   Social Determinants of Health:       SDOH Screenings Food Insecurity: No Food Insecurity (08/22/2022)     Received from [facility] Transportation Needs: No Transportation Needs (08/22/2022)     Received from [facility] Utilities: Not At Risk (08/22/2022)     Received from [facility] Financial Resource Strain: Low Risk  (08/22/2022)     Received from [facility] Social Connections: Unknown (11/19/2021)     Received from [facility] Tobacco Use: Medium Risk (06/16/2023)   Additional history:  {Additional history obtained from emr, family at bedside   Lab Tests:  I Ordered, and personally interpreted labs.  The pertinent results include:  \ As per ED course    Imaging Studies:  I ordered imaging studies including cxr I independently visualized and interpreted imaging which showed  no acute findings I agree with the radiologist interpretation  Cardiac Monitoring/ECG:       The patient was maintained on a cardiac monitor.  I personally viewed and interpreted the cardiac monitored which showed an underlying rhythm of:    Medicines ordered and  prescription drug management:  I ordered medication including Medications potassium chloride 10 mEq in 100 mL IVPB (10 mEq Intravenous New Bag/Given 06/16/23 2138) iohexol (OMNIPAQUE) 300 MG/ML solution 100 mL (has no administration in time range) lactated ringers infusion (150 mL/hr Intravenous New Bag/Given 06/16/23 2202) metroNIDAZOLE (FLAGYL) IVPB 500 mg (has no administration in time range) sodium chloride 0.9 % bolus 1,000 mL (0 mLs Intravenous Stopped 06/16/23 2115) ondansetron (ZOFRAN) injection 4 mg (4 mg Intravenous Given 06/16/23 1812) potassium chloride SA (KLOR-CON M) CR tablet 40 mEq (40 mEq Oral Given 06/16/23 1856) lactated ringers bolus 1,000 mL (0 mLs Intravenous Stopped 06/16/23 2115) acetaminophen (TYLENOL) tablet 1,000 mg (1,000 mg Oral Given 06/16/23 1856) amLODipine (NORVASC) tablet 10 mg (10 mg Oral Given 06/16/23 1856) prochlorperazine (COMPAZINE) injection 5 mg (5 mg Intravenous Given 06/16/23 1855) ceFEPIme (MAXIPIME) 2 g in sodium chloride 0.9 % 100 mL IVPB (0 g Intravenous Stopped 06/16/23 2029) metroNIDAZOLE (FLAGYL) IVPB 500 mg (0 mg Intravenous Stopped 06/16/23 2133) vancomycin (VANCOCIN) IVPB 1000 mg/200 mL premix (0 mg Intravenous Stopped 06/16/23 2149) LORazepam (ATIVAN) injection 1 mg (1 mg Intramuscular Given 06/16/23 2135) ketorolac (TORADOL) 30 MG/ML injection 30 mg (30 mg Intravenous Given 06/16/23 2134) for vomiting, pain, headache Reevaluation of the patient after these medicines showed that the patient improved I have reviewed the patients home medicines and have made adjustments as needed  Test Considered:       CT abd- benign exam, however Hospitalist would like to proceed to r/o intra abdominal pathology   Critical Interventions:       fluids, abx  Consultations Obtained: Dr. Adela Glimpse for admission  Problem List / ED Course:       (N30.01) Acute cystitis with hematuria  (primary encounter diagnosis)  (R65.10) SIRS (systemic  inflammatory response syndrome) (HCC)  (D72.819) Leukopenia, unspecified type  (I10) Uncontrolled hypertension   MDM: patient here with fever, vomiting. Likely urinary source.  Will need admission      Amount and/or Complexity of Data Reviewed Labs: ordered. Decision-making details documented in ED Course. Radiology: ordered. ECG/medicine tests: ordered.  Risk OTC drugs. Prescription drug management. Decision regarding hospitalization.        Final Clinical Impression(s) / ED Diagnoses Final diagnoses:  None    Rx / DC Orders ED Discharge Orders  None         Arthor Captain, PA-C 06/16/23 2224    Wynetta Fines, MD 06/16/23 2230

## 2023-06-16 NOTE — Assessment & Plan Note (Signed)
-  will monitor on tele avoid QT prolonging medications, rehydrate correct electrolytes ? ?

## 2023-06-16 NOTE — Assessment & Plan Note (Signed)
Spoke about need to cut back Family endorses that patient at times looks more pale when she has been drinking Patient denies drinking every day denies history of DTs

## 2023-06-16 NOTE — Progress Notes (Signed)
ED Pharmacy Antibiotic Sign Off  An antibiotic consult was received from an ED provider for vancomycin and cefepime per pharmacy dosing. A chart review was completed to assess appropriateness.   The following one time order(s) were placed: vancomycin 1 g + cefepime 2 g  Further antibiotic and/or antibiotic pharmacy consults should be ordered by the admitting provider if indicated.   Thank you for allowing pharmacy to be a part of this patient's care.   Pricilla Riffle, PharmD, BCPS Clinical Pharmacist 06/16/2023 7:35 PM

## 2023-06-16 NOTE — Assessment & Plan Note (Addendum)
-  SIRS criteria met with low white blood cell count,       Component Value Date/Time   WBC 2.9 (L) 06/16/2023 1801   LYMPHSABS 0.9 06/16/2023 1801   LYMPHSABS 2.0 08/21/2018 1500     tachycardia   ,   fever   RR >20 Today's Vitals   06/16/23 2100 06/16/23 2115 06/16/23 2130 06/16/23 2145  BP: (!) 138/104 (!) 143/94 (!) 132/108 (!) 128/98  Pulse: (!) 109 (!) 104 (!) 113 (!) 106  Resp: 17 (!) 21 17 19   Temp:      TempSrc:      SpO2: 100% 100% 100% 100%  Weight:      Height:      PainSc:         -Most likely source being:  urinary vs Viral        - Obtain serial lactic acid and procalcitonin level.  - Initiated IV antibiotics in ER: Antibiotics Given (last 72 hours)     Date/Time Action Medication Dose Rate   06/16/23 2001 New Bag/Given   ceFEPIme (MAXIPIME) 2 g in sodium chloride 0.9 % 100 mL IVPB 2 g 200 mL/hr   06/16/23 2029 New Bag/Given   metroNIDAZOLE (FLAGYL) IVPB 500 mg 500 mg 100 mL/hr   06/16/23 2043 New Bag/Given   vancomycin (VANCOCIN) IVPB 1000 mg/200 mL premix 1,000 mg 200 mL/hr       Will continue  on : Broad-spectrum antibiotics for now until source determined UA is a strong possibility but await results of CT abdomen   - await results of blood and urine culture  - Rehydrate aggressively  Intravenous fluids were administered,    30cc/kg fluid  Given possibility of viral illness will obtain respiratory panel and check procalcitonin   10:11 PM

## 2023-06-16 NOTE — Assessment & Plan Note (Signed)
Chronic stable patient is to continue to follow-up as an outpatient for iron infusions check iron levels

## 2023-06-16 NOTE — Assessment & Plan Note (Addendum)
Could be in the setting of heavy alcohol use although patient denies daily drinking.  Could be also in the setting of acute illness patient does have ongoing care with hematology oncology.  If persists may need further evaluation

## 2023-06-16 NOTE — Sepsis Progress Note (Signed)
[facility] following for sepsis protocol. 

## 2023-06-16 NOTE — Assessment & Plan Note (Addendum)
Questionable evidence of UTI with suprapubic tenderness.  CT abdomen pelvis pending to evaluate for any further intra-abdominal infectious sources For right now should be able to cover cefepime Await results of urine culture

## 2023-06-17 DIAGNOSIS — D529 Folate deficiency anemia, unspecified: Secondary | ICD-10-CM | POA: Diagnosis not present

## 2023-06-17 DIAGNOSIS — R519 Headache, unspecified: Secondary | ICD-10-CM | POA: Diagnosis present

## 2023-06-17 DIAGNOSIS — E876 Hypokalemia: Secondary | ICD-10-CM | POA: Diagnosis present

## 2023-06-17 DIAGNOSIS — J45909 Unspecified asthma, uncomplicated: Secondary | ICD-10-CM | POA: Diagnosis present

## 2023-06-17 DIAGNOSIS — R9431 Abnormal electrocardiogram [ECG] [EKG]: Secondary | ICD-10-CM | POA: Diagnosis present

## 2023-06-17 DIAGNOSIS — E538 Deficiency of other specified B group vitamins: Secondary | ICD-10-CM | POA: Diagnosis present

## 2023-06-17 DIAGNOSIS — G44229 Chronic tension-type headache, not intractable: Secondary | ICD-10-CM | POA: Diagnosis not present

## 2023-06-17 DIAGNOSIS — A419 Sepsis, unspecified organism: Secondary | ICD-10-CM | POA: Diagnosis not present

## 2023-06-17 DIAGNOSIS — A072 Cryptosporidiosis: Secondary | ICD-10-CM | POA: Diagnosis present

## 2023-06-17 DIAGNOSIS — F17200 Nicotine dependence, unspecified, uncomplicated: Secondary | ICD-10-CM | POA: Diagnosis present

## 2023-06-17 DIAGNOSIS — Z1152 Encounter for screening for COVID-19: Secondary | ICD-10-CM | POA: Diagnosis not present

## 2023-06-17 DIAGNOSIS — D500 Iron deficiency anemia secondary to blood loss (chronic): Secondary | ICD-10-CM | POA: Diagnosis present

## 2023-06-17 DIAGNOSIS — R197 Diarrhea, unspecified: Secondary | ICD-10-CM | POA: Diagnosis not present

## 2023-06-17 DIAGNOSIS — R0682 Tachypnea, not elsewhere classified: Secondary | ICD-10-CM | POA: Diagnosis not present

## 2023-06-17 DIAGNOSIS — Z79899 Other long term (current) drug therapy: Secondary | ICD-10-CM | POA: Diagnosis not present

## 2023-06-17 DIAGNOSIS — A414 Sepsis due to anaerobes: Secondary | ICD-10-CM | POA: Diagnosis present

## 2023-06-17 DIAGNOSIS — I10 Essential (primary) hypertension: Secondary | ICD-10-CM | POA: Diagnosis present

## 2023-06-17 DIAGNOSIS — Z8249 Family history of ischemic heart disease and other diseases of the circulatory system: Secondary | ICD-10-CM | POA: Diagnosis not present

## 2023-06-17 DIAGNOSIS — R6510 Systemic inflammatory response syndrome (SIRS) of non-infectious origin without acute organ dysfunction: Secondary | ICD-10-CM | POA: Diagnosis present

## 2023-06-17 DIAGNOSIS — K625 Hemorrhage of anus and rectum: Secondary | ICD-10-CM | POA: Diagnosis not present

## 2023-06-17 DIAGNOSIS — N3001 Acute cystitis with hematuria: Secondary | ICD-10-CM | POA: Diagnosis present

## 2023-06-17 DIAGNOSIS — E871 Hypo-osmolality and hyponatremia: Secondary | ICD-10-CM | POA: Diagnosis present

## 2023-06-17 DIAGNOSIS — D649 Anemia, unspecified: Secondary | ICD-10-CM | POA: Insufficient documentation

## 2023-06-17 DIAGNOSIS — D709 Neutropenia, unspecified: Secondary | ICD-10-CM | POA: Diagnosis present

## 2023-06-17 LAB — CBC WITH DIFFERENTIAL/PLATELET
Abs Immature Granulocytes: 0.03 10*3/uL (ref 0.00–0.07)
Basophils Absolute: 0 10*3/uL (ref 0.0–0.1)
Basophils Relative: 0 %
Eosinophils Absolute: 0.2 10*3/uL (ref 0.0–0.5)
Eosinophils Relative: 6 %
HCT: 29.5 % — ABNORMAL LOW (ref 36.0–46.0)
Hemoglobin: 9.7 g/dL — ABNORMAL LOW (ref 12.0–15.0)
Immature Granulocytes: 1 %
Lymphocytes Relative: 35 %
Lymphs Abs: 1 10*3/uL (ref 0.7–4.0)
MCH: 31.1 pg (ref 26.0–34.0)
MCHC: 32.9 g/dL (ref 30.0–36.0)
MCV: 94.6 fL (ref 80.0–100.0)
Monocytes Absolute: 0.4 10*3/uL (ref 0.1–1.0)
Monocytes Relative: 12 %
Neutro Abs: 1.3 10*3/uL — ABNORMAL LOW (ref 1.7–7.7)
Neutrophils Relative %: 46 %
Platelets: 356 10*3/uL (ref 150–400)
RBC: 3.12 MIL/uL — ABNORMAL LOW (ref 3.87–5.11)
RDW: 15.2 % (ref 11.5–15.5)
WBC: 2.9 10*3/uL — ABNORMAL LOW (ref 4.0–10.5)
nRBC: 0 % (ref 0.0–0.2)

## 2023-06-17 LAB — RESPIRATORY PANEL BY PCR

## 2023-06-17 LAB — COMPREHENSIVE METABOLIC PANEL
ALT: 16 U/L (ref 0–44)
AST: 27 U/L (ref 15–41)
Albumin: 3 g/dL — ABNORMAL LOW (ref 3.5–5.0)
Alkaline Phosphatase: 85 U/L (ref 38–126)
Anion gap: 4 — ABNORMAL LOW (ref 5–15)
BUN: 9 mg/dL (ref 6–20)
CO2: 24 mmol/L (ref 22–32)
Calcium: 8.1 mg/dL — ABNORMAL LOW (ref 8.9–10.3)
Chloride: 107 mmol/L (ref 98–111)
Creatinine, Ser: 0.64 mg/dL (ref 0.44–1.00)
GFR, Estimated: >60 mL/min (ref >60)
Glucose, Bld: 97 mg/dL (ref 70–99)
Potassium: 3.6 mmol/L (ref 3.5–5.1)
Sodium: 135 mmol/L (ref 135–145)
Total Bilirubin: 0.2 mg/dL (ref <1.2)
Total Protein: 5.7 g/dL — ABNORMAL LOW (ref 6.5–8.1)

## 2023-06-17 LAB — PREALBUMIN: Prealbumin: 24 mg/dL (ref 18–38)

## 2023-06-17 LAB — VITAMIN B12: Vitamin B-12: 463 pg/mL (ref 180–914)

## 2023-06-17 LAB — RETICULOCYTES
Immature Retic Fract: 29.9 % — ABNORMAL HIGH (ref 2.3–15.9)
RBC.: 3.21 MIL/uL — ABNORMAL LOW (ref 3.87–5.11)
Retic Count, Absolute: 91.2 10*3/uL (ref 19.0–186.0)
Retic Ct Pct: 2.8 % (ref 0.4–3.1)

## 2023-06-17 LAB — FOLATE: Folate: 5.6 ng/mL — ABNORMAL LOW (ref >5.9)

## 2023-06-17 LAB — TSH: TSH: 1.349 u[IU]/mL (ref 0.350–4.500)

## 2023-06-17 LAB — HIV ANTIBODY (ROUTINE TESTING W REFLEX): HIV Screen 4th Generation wRfx: NONREACTIVE

## 2023-06-17 LAB — PHOSPHORUS: Phosphorus: 1.9 mg/dL — ABNORMAL LOW (ref 2.5–4.6)

## 2023-06-17 LAB — C DIFFICILE QUICK SCREEN W PCR REFLEX
C Diff antigen: NEGATIVE
C Diff interpretation: NOT DETECTED
C Diff toxin: NEGATIVE

## 2023-06-17 LAB — IRON AND TIBC
Iron: 15 ug/dL — ABNORMAL LOW (ref 28–170)
Saturation Ratios: 5 % — ABNORMAL LOW (ref 10.4–31.8)
TIBC: 301 ug/dL (ref 250–450)
UIBC: 286 ug/dL

## 2023-06-17 LAB — CK: Total CK: 59 U/L (ref 38–234)

## 2023-06-17 LAB — FERRITIN: Ferritin: 11 ng/mL (ref 11–307)

## 2023-06-17 LAB — ETHANOL: Alcohol, Ethyl (B): <10 mg/dL (ref <10)

## 2023-06-17 LAB — PROCALCITONIN: Procalcitonin: 0.12 ng/mL

## 2023-06-17 LAB — MAGNESIUM: Magnesium: 1.5 mg/dL — ABNORMAL LOW (ref 1.7–2.4)

## 2023-06-17 MED ORDER — MAGNESIUM SULFATE 2 GM/50ML IV SOLN
2.0000 g | Freq: Once | INTRAVENOUS | Status: AC
  Administered 2023-06-17: 2 g via INTRAVENOUS
  Filled 2023-06-17: qty 50

## 2023-06-17 MED ORDER — MELATONIN 5 MG PO TABS
5.0000 mg | ORAL_TABLET | Freq: Every evening | ORAL | Status: AC | PRN
  Administered 2023-06-17 – 2023-06-18 (×2): 5 mg via ORAL
  Filled 2023-06-17 (×2): qty 1

## 2023-06-17 MED ORDER — KETOROLAC TROMETHAMINE 30 MG/ML IJ SOLN
30.0000 mg | Freq: Once | INTRAMUSCULAR | Status: AC
  Administered 2023-06-17: 30 mg via INTRAVENOUS
  Filled 2023-06-17: qty 1

## 2023-06-17 MED ORDER — LACTATED RINGERS IV SOLN: INTRAVENOUS | Status: AC

## 2023-06-17 MED ORDER — DIPHENHYDRAMINE HCL 50 MG/ML IJ SOLN
25.0000 mg | Freq: Once | INTRAMUSCULAR | Status: AC
  Administered 2023-06-17: 25 mg via INTRAVENOUS
  Filled 2023-06-17: qty 1

## 2023-06-17 MED ORDER — POTASSIUM PHOSPHATES 15 MMOLE/5ML IV SOLN
30.0000 mmol | Freq: Once | INTRAVENOUS | Status: AC
  Administered 2023-06-17: 30 mmol via INTRAVENOUS
  Filled 2023-06-17: qty 10

## 2023-06-17 MED ORDER — METOCLOPRAMIDE HCL 5 MG/ML IJ SOLN
10.0000 mg | Freq: Once | INTRAMUSCULAR | Status: AC
  Administered 2023-06-17: 10 mg via INTRAVENOUS
  Filled 2023-06-17: qty 2

## 2023-06-17 MED ORDER — FOLIC ACID 1 MG PO TABS
1.0000 mg | ORAL_TABLET | Freq: Every day | ORAL | Status: DC
  Administered 2023-06-17 – 2023-06-20 (×4): 1 mg via ORAL
  Filled 2023-06-17 (×5): qty 1

## 2023-06-17 NOTE — Progress Notes (Signed)
Progress Note    Cheryl Daniels   ZOX:[id number]  DOB: [birth year]  DOA: 06/16/2023     0 PCP: Murlean Caller, MD  Initial CC: Fever, diarrhea  Hospital Course: Ms. Aujla is a 42 yo female with PMH HTN, asthma who presented with fever, diarrhea, and weakness.  Temperature has been up to 102 F at home.  Symptoms had lasted approximately 6 days and due to no improvement, she presented for further evaluation. She was started on broad-spectrum antibiotics on admission due to concern for sepsis of unknown etiology.  Interval History:  Seen this morning with husband present bedside.  Endorsed ongoing diarrhea that was not resolving along with fevers at home.  Due to not feeling better, she presented for further workup.  Assessment and Plan: * Sepsis (HCC) - Tachycardia, tachypnea, neutropenia; on admission there was no source but after further collateral information, she has been having diarrhea for almost 1 week and CT A/P consistent with possible diarrheal illness - Checking stool studies - Discontinue antibiotics for now and await stool studies and monitor clinically  Diarrhea - Patient presents with approximately 6 days of ongoing diarrhea and difficulty maintaining adequate nutrition - CT abdomen/pelvis showed air-fluid levels throughout the colon compatible with diarrheal illness - Check stool studies - Hold antibiotics for now  Hypomagnesemia - Replete as needed  Hypophosphatemia - Replete as needed  Normocytic anemia - Mixed with iron deficiency and folic acid deficiency - Baseline hemoglobin approximately 9 g/dL, currently at baseline  Hypokalemia - Replete as needed  Folic acid deficiency - Patient endorses compliance with oral folate at home - Still low on admission, 5.6 ng/mL - Continue folic acid  Iron deficiency anemia due to chronic blood loss - Iron studies are low - Continue oral iron at discharge   Old records reviewed in assessment of this  patient  Antimicrobials: Cefepime 06/16/2023 x 1 Flagyl 06/16/2023 x 1 Vancomycin 06/16/2023 x 1  DVT prophylaxis:  SCDs Start: 06/16/23 2312   Code Status:   Code Status: Full Code  Mobility Assessment (Last 72 Hours)     Mobility Assessment     Row Name 06/17/23 0959 06/16/23 2345         Does patient have an order for bedrest or is patient medically unstable No - Continue assessment No - Continue assessment      What is the highest level of mobility based on the progressive mobility assessment? Level 6 (Walks independently in room and hall) - Balance while walking in room without assist - Complete Level 6 (Walks independently in room and hall) - Balance while walking in room without assist - Complete               Barriers to discharge: none Disposition Plan:  Home Status is: Inpt  Objective: Blood pressure (!) 134/97, pulse 97, temperature 98.2 F (36.8 C), temperature source Oral, resp. rate 17, height 5\' 3"  (1.6 m), weight 72.6 kg, last menstrual period 06/11/2023, SpO2 97%, unknown if currently breastfeeding.  Examination:  Physical Exam Constitutional:      Appearance: Normal appearance.  HENT:     Head: Normocephalic and atraumatic.     Mouth/Throat:     Mouth: Mucous membranes are moist.  Eyes:     Extraocular Movements: Extraocular movements intact.  Cardiovascular:     Rate and Rhythm: Normal rate and regular rhythm.  Pulmonary:     Effort: Pulmonary effort is normal. No respiratory distress.     Breath sounds: Normal  breath sounds. No wheezing.  Abdominal:     General: Bowel sounds are normal. There is no distension.     Palpations: Abdomen is soft.     Tenderness: There is abdominal tenderness in the right lower quadrant.  Musculoskeletal:        General: Normal range of motion.     Cervical back: Normal range of motion and neck supple.  Skin:    General: Skin is warm and dry.  Neurological:     General: No focal deficit present.     Mental  Status: She is alert.  Psychiatric:        Mood and Affect: Mood normal.      Consultants:    Procedures:    Data Reviewed: Results for orders placed or performed during the hospital encounter of 06/16/23 (from the past 24 hour(s))  Resp panel by RT-PCR (RSV, Flu A&B, Covid) Anterior Nasal Swab     Status: None   Collection Time: 06/16/23  5:30 PM   Specimen: Anterior Nasal Swab  Result Value Ref Range   SARS Coronavirus 2 by RT PCR NEGATIVE NEGATIVE   Influenza A by PCR NEGATIVE NEGATIVE   Influenza B by PCR NEGATIVE NEGATIVE   Resp Syncytial Virus by PCR NEGATIVE NEGATIVE  Respiratory (~20 pathogens) panel by PCR     Status: None   Collection Time: 06/16/23  5:30 PM   Specimen: Nasopharyngeal Swab; Respiratory  Result Value Ref Range   Adenovirus NOT DETECTED NOT DETECTED   Coronavirus 229E NOT DETECTED NOT DETECTED   Coronavirus HKU1 NOT DETECTED NOT DETECTED   Coronavirus NL63 NOT DETECTED NOT DETECTED   Coronavirus OC43 NOT DETECTED NOT DETECTED   Metapneumovirus NOT DETECTED NOT DETECTED   Rhinovirus / Enterovirus NOT DETECTED NOT DETECTED   Influenza A NOT DETECTED NOT DETECTED   Influenza B NOT DETECTED NOT DETECTED   Parainfluenza Virus 1 NOT DETECTED NOT DETECTED   Parainfluenza Virus 2 NOT DETECTED NOT DETECTED   Parainfluenza Virus 3 NOT DETECTED NOT DETECTED   Parainfluenza Virus 4 NOT DETECTED NOT DETECTED   Respiratory Syncytial Virus NOT DETECTED NOT DETECTED   Bordetella pertussis NOT DETECTED NOT DETECTED   Bordetella Parapertussis NOT DETECTED NOT DETECTED   Chlamydophila pneumoniae NOT DETECTED NOT DETECTED   Mycoplasma pneumoniae NOT DETECTED NOT DETECTED  I-Stat Lactic Acid, ED     Status: Abnormal   Collection Time: 06/16/23  6:00 PM  Result Value Ref Range   Lactic Acid, Venous 2.1 (HH) 0.5 - 1.9 mmol/L   Comment NOTIFIED PHYSICIAN   I-stat chem 8, ED     Status: Abnormal   Collection Time: 06/16/23  6:00 PM  Result Value Ref Range   Sodium  134 (L) 135 - 145 mmol/L   Potassium 3.2 (L) 3.5 - 5.1 mmol/L   Chloride 101 98 - 111 mmol/L   BUN 10 6 - 20 mg/dL   Creatinine, Ser 8.11 0.44 - 1.00 mg/dL   Glucose, Bld 914 (H) 70 - 99 mg/dL   Calcium, Ion 7.82 (L) 1.15 - 1.40 mmol/L   TCO2 26 22 - 32 mmol/L   Hemoglobin 13.9 12.0 - 15.0 g/dL   HCT 95.6 21.3 - 08.6 %  Comprehensive metabolic panel     Status: Abnormal   Collection Time: 06/16/23  6:01 PM  Result Value Ref Range   Sodium 133 (L) 135 - 145 mmol/L   Potassium 3.1 (L) 3.5 - 5.1 mmol/L   Chloride 98 98 - 111 mmol/L  CO2 21 (L) 22 - 32 mmol/L   Glucose, Bld 123 (H) 70 - 99 mg/dL   BUN 11 6 - 20 mg/dL   Creatinine, Ser 1.61 0.44 - 1.00 mg/dL   Calcium 9.0 8.9 - 09.6 mg/dL   Total Protein 7.5 6.5 - 8.1 g/dL   Albumin 3.8 3.5 - 5.0 g/dL   AST 38 15 - 41 U/L   ALT 18 0 - 44 U/L   Alkaline Phosphatase 113 38 - 126 U/L   Total Bilirubin 0.5 <1.2 mg/dL   GFR, Estimated >04 >54 mL/min   Anion gap 14 5 - 15  CBC with Differential     Status: Abnormal   Collection Time: 06/16/23  6:01 PM  Result Value Ref Range   WBC 2.9 (L) 4.0 - 10.5 K/uL   RBC 3.91 3.87 - 5.11 MIL/uL   Hemoglobin 12.1 12.0 - 15.0 g/dL   HCT 09.8 11.9 - 14.7 %   MCV 92.3 80.0 - 100.0 fL   MCH 30.9 26.0 - 34.0 pg   MCHC 33.5 30.0 - 36.0 g/dL   RDW 82.9 56.2 - 13.0 %   Platelets 414 (H) 150 - 400 K/uL   nRBC 1.4 (H) 0.0 - 0.2 %   Neutrophils Relative % 55 %   Neutro Abs 1.6 (L) 1.7 - 7.7 K/uL   Lymphocytes Relative 31 %   Lymphs Abs 0.9 0.7 - 4.0 K/uL   Monocytes Relative 7 %   Monocytes Absolute 0.2 0.1 - 1.0 K/uL   Eosinophils Relative 6 %   Eosinophils Absolute 0.2 0.0 - 0.5 K/uL   Basophils Relative 0 %   Basophils Absolute 0.0 0.0 - 0.1 K/uL   Immature Granulocytes 1 %   Abs Immature Granulocytes 0.03 0.00 - 0.07 K/uL  hCG, serum, qualitative     Status: None   Collection Time: 06/16/23  6:01 PM  Result Value Ref Range   Preg, Serum NEGATIVE NEGATIVE  Lipase, blood     Status: None    Collection Time: 06/16/23  6:01 PM  Result Value Ref Range   Lipase 26 11 - 51 U/L  Troponin I (High Sensitivity)     Status: None   Collection Time: 06/16/23  6:01 PM  Result Value Ref Range   Troponin I (High Sensitivity) 3 <18 ng/L  Blood Culture (routine x 2)     Status: None (Preliminary result)   Collection Time: 06/16/23  6:11 PM   Specimen: BLOOD  Result Value Ref Range   Specimen Description      BLOOD LEFT ANTECUBITAL Performed at [facility], [address] [place], [place] [address]    Special Requests      BOTTLES DRAWN AEROBIC AND ANAEROBIC Blood Culture adequate volume Performed at [facility], [address] [place], [place] [address]    Culture      NO GROWTH < 12 HOURS Performed at [facility], [address], [place], [place] [address]    Report Status PENDING   Blood Culture (routine x 2)     Status: None (Preliminary result)   Collection Time: 06/16/23  6:12 PM   Specimen: BLOOD  Result Value Ref Range   Specimen Description      BLOOD RIGHT ANTECUBITAL Performed at [facility], [address] [place], [place] [address]    Special Requests      BOTTLES DRAWN AEROBIC AND ANAEROBIC Blood Culture adequate volume Performed at [facility], [address] [place], [place] [address]    Culture      NO GROWTH < 12 HOURS Performed at [facility], [address], [place], [place] [address]    Report Status PENDING   Protime-INR     Status: None   Collection Time: 06/16/23  6:58 PM  Result Value Ref Range   Prothrombin Time 13.1 11.4 - 15.2 seconds   INR 1.0 0.8 - 1.2  APTT     Status: None   Collection Time: 06/16/23  6:58 PM  Result Value Ref Range   aPTT 28 24 - 36 seconds  Troponin I (High Sensitivity)     Status: None   Collection Time: 06/16/23  7:25 PM  Result Value Ref Range   Troponin I (High Sensitivity) 3 <18 ng/L  I-Stat Lactic Acid, ED      Status: None   Collection Time: 06/16/23  7:35 PM  Result Value Ref Range   Lactic Acid, Venous 1.0 0.5 - 1.9 mmol/L  Urinalysis, w/ Reflex to Culture (Infection Suspected) -Urine, Clean Catch     Status: Abnormal   Collection Time: 06/16/23  7:50 PM  Result Value Ref Range   Specimen Source URINE, CLEAN CATCH    Color, Urine AMBER (A) YELLOW   APPearance CLOUDY (A) CLEAR   Specific Gravity, Urine 1.026 1.005 - 1.030   pH 5.0 5.0 - 8.0   Glucose, UA NEGATIVE NEGATIVE mg/dL   Hgb urine dipstick SMALL (A) NEGATIVE   Bilirubin Urine SMALL (A) NEGATIVE   Ketones, ur NEGATIVE NEGATIVE mg/dL   Protein, ur 30 (A) NEGATIVE mg/dL   Nitrite NEGATIVE NEGATIVE   Leukocytes,Ua TRACE (A) NEGATIVE   RBC / HPF 0-5 0 - 5 RBC/hpf   WBC, UA 6-10 0 - 5 WBC/hpf   Bacteria, UA RARE (A) NONE SEEN   Squamous Epithelial / HPF 21-50 0 - 5 /HPF   Mucus PRESENT    Hyaline Casts, UA PRESENT   Rapid urine drug screen (hospital performed)     Status: Abnormal   Collection Time: 06/16/23  9:38 PM  Result Value Ref Range   Opiates NONE DETECTED NONE DETECTED   Cocaine NONE DETECTED NONE DETECTED   Benzodiazepines NONE DETECTED NONE DETECTED   Amphetamines NONE DETECTED NONE DETECTED   Tetrahydrocannabinol POSITIVE (A) NONE DETECTED   Barbiturates NONE DETECTED NONE DETECTED  I-Stat CG4 Lactic Acid     Status: Abnormal   Collection Time: 06/16/23 10:10 PM  Result Value Ref Range   Lactic Acid, Venous <0.3 (L) 0.5 - 1.9 mmol/L  Vitamin B12     Status: None   Collection Time: 06/17/23  3:50 AM  Result Value Ref Range   Vitamin B-12 463 180 - 914 pg/mL  Folate     Status: Abnormal   Collection Time: 06/17/23  3:50 AM  Result Value Ref Range   Folate 5.6 (L) >5.9 ng/mL  Iron and TIBC     Status: Abnormal   Collection Time: 06/17/23  3:50 AM  Result Value Ref Range   Iron 15 (L) 28 - 170 ug/dL   TIBC 213 086 - 578 ug/dL   Saturation Ratios 5 (L) 10.4 - 31.8 %   UIBC 286 ug/dL  Ferritin     Status: None    Collection Time: 06/17/23  3:50 AM  Result Value Ref Range   Ferritin 11 11 - 307 ng/mL  Reticulocytes     Status: Abnormal   Collection Time: 06/17/23  3:50 AM  Result Value Ref Range   Retic Ct Pct 2.8 0.4 - 3.1 %   RBC. 3.21 (L) 3.87 - 5.11 MIL/uL   Retic Count, Absolute 91.2 19.0 - 186.0 K/uL   Immature Retic Fract 29.9 (H) 2.3 - 15.9 %  Ethanol     Status: None   Collection Time: 06/17/23  3:50 AM  Result Value Ref Range   Alcohol, Ethyl (B) <10 <10 mg/dL  TSH     Status: None   Collection Time: 06/17/23  3:50 AM  Result Value Ref Range   TSH 1.349 0.350 - 4.500 uIU/mL  Prealbumin     Status: None   Collection Time: 06/17/23  3:50 AM  Result Value Ref Range   Prealbumin 24 18 - 38 mg/dL  CBC with Differential     Status: Abnormal   Collection Time: 06/17/23  3:50 AM  Result Value Ref Range   WBC 2.9 (L) 4.0 - 10.5 K/uL   RBC 3.12 (L) 3.87 - 5.11 MIL/uL   Hemoglobin 9.7 (L) 12.0 - 15.0 g/dL   HCT 40.9 (L) 81.1 - 91.4 %   MCV 94.6 80.0 - 100.0 fL   MCH 31.1 26.0 - 34.0 pg   MCHC 32.9 30.0 - 36.0 g/dL   RDW 78.2 95.6 - 21.3 %   Platelets 356 150 - 400 K/uL   nRBC 0.0 0.0 - 0.2 %   Neutrophils Relative % 46 %   Neutro Abs 1.3 (L) 1.7 - 7.7 K/uL   Lymphocytes Relative 35 %   Lymphs Abs 1.0 0.7 - 4.0 K/uL   Monocytes Relative 12 %   Monocytes Absolute 0.4 0.1 - 1.0 K/uL   Eosinophils Relative 6 %   Eosinophils Absolute 0.2 0.0 - 0.5 K/uL   Basophils Relative 0 %   Basophils Absolute 0.0 0.0 - 0.1 K/uL   Immature Granulocytes 1 %   Abs Immature Granulocytes 0.03 0.00 - 0.07 K/uL  HIV Antibody (routine testing w rflx)     Status: None   Collection Time: 06/17/23  3:50 AM  Result Value Ref Range   HIV Screen 4th Generation wRfx Non Reactive Non Reactive  Magnesium     Status: Abnormal   Collection Time: 06/17/23  3:50 AM  Result Value Ref Range   Magnesium 1.5 (L) 1.7 - 2.4 mg/dL  Phosphorus     Status: Abnormal   Collection Time: 06/17/23  3:50 AM  Result  Value Ref Range   Phosphorus 1.9 (L) 2.5 - 4.6 mg/dL  Comprehensive metabolic panel     Status: Abnormal   Collection Time: 06/17/23  3:50 AM  Result Value Ref Range   Sodium 135 135 - 145 mmol/L   Potassium 3.6 3.5 - 5.1 mmol/L   Chloride 107 98 - 111 mmol/L   CO2 24 22 - 32 mmol/L   Glucose, Bld 97 70 - 99 mg/dL   BUN 9 6 - 20 mg/dL   Creatinine, Ser 0.86 0.44 - 1.00 mg/dL   Calcium 8.1 (L) 8.9 - 10.3 mg/dL   Total Protein 5.7 (L) 6.5 - 8.1 g/dL   Albumin 3.0 (L) 3.5 - 5.0 g/dL   AST 27 15 - 41 U/L   ALT 16 0 - 44 U/L   Alkaline Phosphatase 85 38 - 126 U/L   Total Bilirubin 0.2 <1.2 mg/dL   GFR, Estimated >57 >84 mL/min   Anion gap 4 (L) 5 - 15  Procalcitonin     Status: None   Collection  Time: 06/17/23  3:51 AM  Result Value Ref Range   Procalcitonin 0.12 ng/mL  CK     Status: None   Collection Time: 06/17/23  3:51 AM  Result Value Ref Range   Total CK 59 38 - 234 U/L    I have reviewed pertinent nursing notes, vitals, labs, and images as necessary. I have ordered labwork to follow up on as indicated.  I have reviewed the last notes from staff over past 24 hours. I have discussed patient's care plan and test results with nursing staff, CM/SW, and other staff as appropriate.  Time spent: Greater than 50% of the 55 minute visit was spent in counseling/coordination of care for the patient as laid out in the A&P.   LOS: 0 days   Lewie Chamber, MD [facility] 06/17/2023, 1:40 PM

## 2023-06-17 NOTE — Assessment & Plan Note (Signed)
-   Replete as needed

## 2023-06-17 NOTE — Assessment & Plan Note (Signed)
-   Patient presents with approximately 6 days of ongoing diarrhea and difficulty maintaining adequate nutrition - CT abdomen/pelvis showed air-fluid levels throughout the colon compatible with diarrheal illness; also having right sided abdominal tenderness -GI panel returned with Cryptosporidium -HIV negative - Theoretically should be self resolving; she was endorsing some improvement in her diarrhea today but was about to have a bowel movement during exam -Continue enteric precautions -Discussing with ID if anti-parasitic warranted

## 2023-06-17 NOTE — Progress Notes (Signed)
SATURATION QUALIFICATIONS: (This note is used to comply with regulatory documentation for home oxygen)  Patient Saturations on Room Air at Rest = 99%  Patient Saturations on Room Air while Ambulating = 97%  Please briefly explain why patient needs home oxygen: pt does not need O2

## 2023-06-17 NOTE — Assessment & Plan Note (Signed)
-   Patient endorses compliance with oral folate at home - Still low on admission, 5.6 ng/mL - Continue folic acid

## 2023-06-17 NOTE — Hospital Course (Signed)
Cheryl Daniels is a 42 yo female with PMH HTN, asthma who presented with fever, diarrhea, and weakness.  Temperature has been up to 102 F at home.  Symptoms had lasted approximately 6 days and due to no improvement, she presented for further evaluation. She was started on broad-spectrum antibiotics on admission due to concern for sepsis of unknown etiology.

## 2023-06-17 NOTE — Assessment & Plan Note (Signed)
- 

## 2023-06-17 NOTE — Assessment & Plan Note (Signed)
Replete as needed

## 2023-06-17 NOTE — Assessment & Plan Note (Signed)
-   Tachycardia, tachypnea, neutropenia; on admission there was no source but after further collateral information, she has been having diarrhea for almost 1 week and CT A/P consistent with possible diarrheal illness - Checking stool studies - Discontinue antibiotics for now and await stool studies and monitor clinically

## 2023-06-17 NOTE — Assessment & Plan Note (Signed)
-   Iron studies are low - Continue oral iron at discharge

## 2023-06-17 NOTE — Progress Notes (Signed)
Mobility Specialist - Progress Note  (RA) Pre-mobility: 102 bpm HR, 99% SpO2 During mobility: 165 bpm HR, 97% SpO2 Post-mobility: 95 bpm HR, 100% SPO2   06/17/23 0841  Mobility  Activity Ambulated independently in hallway  Level of Assistance Independent  Assistive Device None  Distance Ambulated (ft) 350 ft  Range of Motion/Exercises Active  Activity Response Tolerated well  Mobility Referral Yes  $Mobility charge 1 Mobility  Mobility Specialist Start Time (ACUTE ONLY) 0830  Mobility Specialist Stop Time (ACUTE ONLY) 0840  Mobility Specialist Time Calculation (min) (ACUTE ONLY) 10 min   Pt was found in bed and agreeable to ambulate. Took x1 brief standing rest break during session due to HR 165 bpm. Able to decrease to 111 bpm within 1 min. Stated feeling her heart racing during session. At EOS returned to bed with all needs met. Call bell in reach.  Billey Chang Mobility Specialist

## 2023-06-17 NOTE — Plan of Care (Signed)
  Problem: Fluid Volume: Goal: Hemodynamic stability will improve Outcome: Progressing   Problem: Coping: Goal: Level of anxiety will decrease Outcome: Progressing   Problem: Pain Management: Goal: General experience of comfort will improve Outcome: Progressing   Problem: Clinical Measurements: Goal: Signs and symptoms of infection will decrease Outcome: Progressing

## 2023-06-17 NOTE — Assessment & Plan Note (Signed)
-   Mixed with iron deficiency and folic acid deficiency - Baseline hemoglobin approximately 9 g/dL, currently at baseline

## 2023-06-18 DIAGNOSIS — A419 Sepsis, unspecified organism: Secondary | ICD-10-CM | POA: Diagnosis not present

## 2023-06-18 DIAGNOSIS — A072 Cryptosporidiosis: Secondary | ICD-10-CM | POA: Diagnosis not present

## 2023-06-18 DIAGNOSIS — G44229 Chronic tension-type headache, not intractable: Secondary | ICD-10-CM | POA: Diagnosis not present

## 2023-06-18 LAB — GASTROINTESTINAL PANEL BY PCR, STOOL (REPLACES STOOL CULTURE)

## 2023-06-18 LAB — BASIC METABOLIC PANEL
Anion gap: 6 (ref 5–15)
BUN: 7 mg/dL (ref 6–20)
CO2: 24 mmol/L (ref 22–32)
Calcium: 8.2 mg/dL — ABNORMAL LOW (ref 8.9–10.3)
Chloride: 106 mmol/L (ref 98–111)
Creatinine, Ser: 0.7 mg/dL (ref 0.44–1.00)
GFR, Estimated: >60 mL/min (ref >60)
Glucose, Bld: 98 mg/dL (ref 70–99)
Potassium: 3.5 mmol/L (ref 3.5–5.1)
Sodium: 136 mmol/L (ref 135–145)

## 2023-06-18 LAB — CBC WITH DIFFERENTIAL/PLATELET
Abs Immature Granulocytes: 0 10*3/uL (ref 0.00–0.07)
Basophils Absolute: 0 10*3/uL (ref 0.0–0.1)
Basophils Relative: 0 %
Eosinophils Absolute: 0.2 10*3/uL (ref 0.0–0.5)
Eosinophils Relative: 4 %
HCT: 25.8 % — ABNORMAL LOW (ref 36.0–46.0)
Hemoglobin: 8.3 g/dL — ABNORMAL LOW (ref 12.0–15.0)
Immature Granulocytes: 0 %
Lymphocytes Relative: 33 %
Lymphs Abs: 1.2 10*3/uL (ref 0.7–4.0)
MCH: 30.2 pg (ref 26.0–34.0)
MCHC: 32.2 g/dL (ref 30.0–36.0)
MCV: 93.8 fL (ref 80.0–100.0)
Monocytes Absolute: 0.3 10*3/uL (ref 0.1–1.0)
Monocytes Relative: 9 %
Neutro Abs: 1.9 10*3/uL (ref 1.7–7.7)
Neutrophils Relative %: 54 %
Platelets: 325 10*3/uL (ref 150–400)
RBC: 2.75 MIL/uL — ABNORMAL LOW (ref 3.87–5.11)
RDW: 14.9 % (ref 11.5–15.5)
WBC: 3.6 10*3/uL — ABNORMAL LOW (ref 4.0–10.5)
nRBC: 0 % (ref 0.0–0.2)

## 2023-06-18 LAB — MAGNESIUM: Magnesium: 1.9 mg/dL (ref 1.7–2.4)

## 2023-06-18 LAB — URINE CULTURE

## 2023-06-18 MED ORDER — KETOROLAC TROMETHAMINE 30 MG/ML IJ SOLN
30.0000 mg | Freq: Four times a day (QID) | INTRAMUSCULAR | Status: DC | PRN
  Administered 2023-06-18 – 2023-06-19 (×3): 30 mg via INTRAVENOUS
  Filled 2023-06-18 (×4): qty 1

## 2023-06-18 MED ORDER — DIPHENHYDRAMINE HCL 50 MG/ML IJ SOLN
25.0000 mg | Freq: Four times a day (QID) | INTRAMUSCULAR | Status: DC | PRN
  Administered 2023-06-18 – 2023-06-20 (×7): 25 mg via INTRAVENOUS
  Filled 2023-06-18 (×7): qty 1

## 2023-06-18 MED ORDER — METOCLOPRAMIDE HCL 5 MG/ML IJ SOLN
10.0000 mg | Freq: Four times a day (QID) | INTRAMUSCULAR | Status: DC | PRN
  Administered 2023-06-18 – 2023-06-20 (×6): 10 mg via INTRAVENOUS
  Filled 2023-06-18 (×4): qty 2

## 2023-06-18 MED ORDER — NITAZOXANIDE 500 MG PO TABS
500.0000 mg | ORAL_TABLET | Freq: Two times a day (BID) | ORAL | Status: DC
  Administered 2023-06-18 – 2023-06-20 (×4): 500 mg via ORAL
  Filled 2023-06-18 (×4): qty 1

## 2023-06-18 MED ORDER — LOPERAMIDE HCL 2 MG PO CAPS: 4.0000 mg | ORAL_CAPSULE | ORAL | Status: DC | PRN

## 2023-06-18 MED ORDER — MORPHINE SULFATE (PF) 2 MG/ML IV SOLN
2.0000 mg | INTRAVENOUS | Status: DC | PRN
  Administered 2023-06-18 – 2023-06-19 (×6): 2 mg via INTRAVENOUS
  Filled 2023-06-18 (×6): qty 1

## 2023-06-18 MED ORDER — DICYCLOMINE HCL 10 MG PO CAPS
10.0000 mg | ORAL_CAPSULE | Freq: Three times a day (TID) | ORAL | Status: DC
  Administered 2023-06-18 – 2023-06-20 (×8): 10 mg via ORAL
  Filled 2023-06-18 (×8): qty 1

## 2023-06-18 NOTE — Plan of Care (Signed)
  Problem: Fluid Volume: Goal: Hemodynamic stability will improve Outcome: Progressing   Problem: Coping: Goal: Level of anxiety will decrease Outcome: Progressing   Problem: Pain Management: Goal: General experience of comfort will improve Outcome: Progressing   Problem: Fluid Volume: Goal: Hemodynamic stability will improve Outcome: Progressing

## 2023-06-18 NOTE — Progress Notes (Signed)
Progress Note    Cheryl Daniels   VHQ:[id number]  DOB: 1980/09/17  DOA: 06/16/2023     1 PCP: Murlean Caller, MD  Initial CC: Fever, diarrhea  Hospital Course: Cheryl Daniels is a 42 yo female with PMH HTN, asthma who presented with fever, diarrhea, and weakness.  Temperature has been up to 102 F at home.  Symptoms had lasted approximately 6 days and due to no improvement, she presented for further evaluation. She was started on broad-spectrum antibiotics on admission due to concern for sepsis of unknown etiology.  Interval History:  Some small improvement in diarrhea today but still having right sided abdominal pain on palpation.  Also still having significant headaches.  Responded a little bit to migraine cocktail yesterday.  Assessment and Plan: * Sepsis (HCC) - Tachycardia, tachypnea, neutropenia; on admission there was no source but after further collateral information, she has been having diarrhea for almost 1 week and CT A/P consistent with diarrheal illness - see above   Cryptosporidial gastroenteritis (HCC) - Patient presents with approximately 6 days of ongoing diarrhea and difficulty maintaining adequate nutrition - CT abdomen/pelvis showed air-fluid levels throughout the colon compatible with diarrheal illness; also having right sided abdominal tenderness -GI panel returned with Cryptosporidium -HIV negative - Theoretically should be self resolving; she was endorsing some improvement in her diarrhea today but was about to have a bowel movement during exam -Continue enteric precautions -Discussing with ID if anti-parasitic warranted  Chronic headache - Possible worsening of underlying headache syndrome from acute infection - Continue morphine and migraine cocktail as needed  Hypomagnesemia - Replete as needed  Hypophosphatemia - Replete as needed  Normocytic anemia - Mixed with iron deficiency and folic acid deficiency - Baseline hemoglobin approximately 9 g/dL,  currently at baseline  Hypokalemia - Replete as needed  Folic acid deficiency - Patient endorses compliance with oral folate at home - Still low on admission, 5.6 ng/mL - Continue folic acid  Iron deficiency anemia due to chronic blood loss - Iron studies are low - Continue oral iron at discharge   Old records reviewed in assessment of this patient  Antimicrobials: Cefepime 06/16/2023 x 1 Flagyl 06/16/2023 x 1 Vancomycin 06/16/2023 x 1  DVT prophylaxis:  SCDs Start: 06/16/23 2312   Code Status:   Code Status: Full Code  Mobility Assessment (Last 72 Hours)     Mobility Assessment     Row Name 06/18/23 0800 06/17/23 2300 06/17/23 0959 06/16/23 2345     Does patient have an order for bedrest or is patient medically unstable No - Continue assessment No - Continue assessment No - Continue assessment No - Continue assessment    What is the highest level of mobility based on the progressive mobility assessment? Level 6 (Walks independently in room and hall) - Balance while walking in room without assist - Complete Level 6 (Walks independently in room and hall) - Balance while walking in room without assist - Complete Level 6 (Walks independently in room and hall) - Balance while walking in room without assist - Complete Level 6 (Walks independently in room and hall) - Balance while walking in room without assist - Complete             Barriers to discharge: none Disposition Plan:  Home Status is: Inpt  Objective: Blood pressure (!) 149/93, pulse 90, temperature 98.3 F (36.8 C), temperature source Oral, resp. rate 19, height 5\' 3"  (1.6 m), weight 72.6 kg, last menstrual period 06/11/2023, SpO2 97%, unknown  if currently breastfeeding.  Examination:  Physical Exam Constitutional:      Appearance: Normal appearance.  HENT:     Head: Normocephalic and atraumatic.     Mouth/Throat:     Mouth: Mucous membranes are moist.  Eyes:     Extraocular Movements: Extraocular  movements intact.  Cardiovascular:     Rate and Rhythm: Normal rate and regular rhythm.  Pulmonary:     Effort: Pulmonary effort is normal. No respiratory distress.     Breath sounds: Normal breath sounds. No wheezing.  Abdominal:     General: Bowel sounds are normal. There is no distension.     Palpations: Abdomen is soft.     Tenderness: There is abdominal tenderness in the right lower quadrant.  Musculoskeletal:        General: Normal range of motion.     Cervical back: Normal range of motion and neck supple.  Skin:    General: Skin is warm and dry.  Neurological:     General: No focal deficit present.     Mental Status: She is alert.  Psychiatric:        Mood and Affect: Mood normal.      Consultants:    Procedures:    Data Reviewed: Results for orders placed or performed during the hospital encounter of 06/16/23 (from the past 24 hour(s))  Basic metabolic panel     Status: Abnormal   Collection Time: 06/18/23  4:15 AM  Result Value Ref Range   Sodium 136 135 - 145 mmol/L   Potassium 3.5 3.5 - 5.1 mmol/L   Chloride 106 98 - 111 mmol/L   CO2 24 22 - 32 mmol/L   Glucose, Bld 98 70 - 99 mg/dL   BUN 7 6 - 20 mg/dL   Creatinine, Ser 3.87 0.44 - 1.00 mg/dL   Calcium 8.2 (L) 8.9 - 10.3 mg/dL   GFR, Estimated >56 >43 mL/min   Anion gap 6 5 - 15  CBC with Differential/Platelet     Status: Abnormal   Collection Time: 06/18/23  4:15 AM  Result Value Ref Range   WBC 3.6 (L) 4.0 - 10.5 K/uL   RBC 2.75 (L) 3.87 - 5.11 MIL/uL   Hemoglobin 8.3 (L) 12.0 - 15.0 g/dL   HCT 32.9 (L) 51.8 - 84.1 %   MCV 93.8 80.0 - 100.0 fL   MCH 30.2 26.0 - 34.0 pg   MCHC 32.2 30.0 - 36.0 g/dL   RDW 66.0 63.0 - 16.0 %   Platelets 325 150 - 400 K/uL   nRBC 0.0 0.0 - 0.2 %   Neutrophils Relative % 54 %   Neutro Abs 1.9 1.7 - 7.7 K/uL   Lymphocytes Relative 33 %   Lymphs Abs 1.2 0.7 - 4.0 K/uL   Monocytes Relative 9 %   Monocytes Absolute 0.3 0.1 - 1.0 K/uL   Eosinophils Relative 4 %    Eosinophils Absolute 0.2 0.0 - 0.5 K/uL   Basophils Relative 0 %   Basophils Absolute 0.0 0.0 - 0.1 K/uL   Immature Granulocytes 0 %   Abs Immature Granulocytes 0.00 0.00 - 0.07 K/uL  Magnesium     Status: None   Collection Time: 06/18/23  4:15 AM  Result Value Ref Range   Magnesium 1.9 1.7 - 2.4 mg/dL    I have reviewed pertinent nursing notes, vitals, labs, and images as necessary. I have ordered labwork to follow up on as indicated.  I have reviewed the last notes from  staff over past 24 hours. I have discussed patient's care plan and test results with nursing staff, CM/SW, and other staff as appropriate.  Time spent: Greater than 50% of the 55 minute visit was spent in counseling/coordination of care for the patient as laid out in the A&P.   LOS: 1 day   Lewie Chamber, MD [facility] 06/18/2023, 2:37 PM

## 2023-06-18 NOTE — Plan of Care (Signed)
  Problem: Pain Management: Goal: General experience of comfort will improve Outcome: Progressing   Problem: Elimination: Goal: Will not experience complications related to bowel motility Outcome: Progressing   Problem: Nutrition: Goal: Adequate nutrition will be maintained Outcome: Progressing   Problem: Clinical Measurements: Goal: Ability to maintain clinical measurements within normal limits will improve Outcome: Progressing   Problem: Education: Goal: Knowledge of General Education information will improve Description: Including pain rating scale, medication(s)/side effects and non-pharmacologic comfort measures Outcome: Progressing

## 2023-06-18 NOTE — Assessment & Plan Note (Signed)
-   Possible worsening of underlying headache syndrome from acute infection - Continue morphine and migraine cocktail as needed

## 2023-06-19 DIAGNOSIS — K625 Hemorrhage of anus and rectum: Secondary | ICD-10-CM

## 2023-06-19 DIAGNOSIS — R0682 Tachypnea, not elsewhere classified: Secondary | ICD-10-CM

## 2023-06-19 DIAGNOSIS — R6510 Systemic inflammatory response syndrome (SIRS) of non-infectious origin without acute organ dysfunction: Secondary | ICD-10-CM | POA: Diagnosis not present

## 2023-06-19 DIAGNOSIS — D529 Folate deficiency anemia, unspecified: Secondary | ICD-10-CM

## 2023-06-19 LAB — CBC
HCT: 28.6 % — ABNORMAL LOW (ref 36.0–46.0)
HCT: 29 % — ABNORMAL LOW (ref 36.0–46.0)
Hemoglobin: 9.1 g/dL — ABNORMAL LOW (ref 12.0–15.0)
Hemoglobin: 9.2 g/dL — ABNORMAL LOW (ref 12.0–15.0)
MCH: 30.3 pg (ref 26.0–34.0)
MCH: 30.4 pg (ref 26.0–34.0)
MCHC: 31.8 g/dL (ref 30.0–36.0)
MCHC: 31.7 g/dL (ref 30.0–36.0)
MCV: 95.3 fL (ref 80.0–100.0)
MCV: 95.7 fL (ref 80.0–100.0)
Platelets: 430 10*3/uL — ABNORMAL HIGH (ref 150–400)
Platelets: 445 10*3/uL — ABNORMAL HIGH (ref 150–400)
RBC: 3 MIL/uL — ABNORMAL LOW (ref 3.87–5.11)
RBC: 3.03 MIL/uL — ABNORMAL LOW (ref 3.87–5.11)
RDW: 15.3 % (ref 11.5–15.5)
RDW: 15.1 % (ref 11.5–15.5)
WBC: 4.2 10*3/uL (ref 4.0–10.5)
WBC: 4.9 10*3/uL (ref 4.0–10.5)
nRBC: 0 % (ref 0.0–0.2)
nRBC: 0 % (ref 0.0–0.2)

## 2023-06-19 LAB — BASIC METABOLIC PANEL
Anion gap: 7 (ref 5–15)
BUN: 9 mg/dL (ref 6–20)
CO2: 26 mmol/L (ref 22–32)
Calcium: 8.4 mg/dL — ABNORMAL LOW (ref 8.9–10.3)
Chloride: 100 mmol/L (ref 98–111)
Creatinine, Ser: 0.59 mg/dL (ref 0.44–1.00)
GFR, Estimated: >60 mL/min (ref >60)
Glucose, Bld: 124 mg/dL — ABNORMAL HIGH (ref 70–99)
Potassium: 3.2 mmol/L — ABNORMAL LOW (ref 3.5–5.1)
Sodium: 133 mmol/L — ABNORMAL LOW (ref 135–145)

## 2023-06-19 MED ORDER — SODIUM CHLORIDE 0.9 % IV SOLN: INTRAVENOUS | Status: DC

## 2023-06-19 MED ORDER — CARMEX CLASSIC LIP BALM EX OINT
1.0000 | TOPICAL_OINTMENT | CUTANEOUS | Status: DC | PRN
  Filled 2023-06-19: qty 10

## 2023-06-19 MED ORDER — FERROUS SULFATE 325 (65 FE) MG PO TABS
325.0000 mg | ORAL_TABLET | Freq: Every day | ORAL | Status: DC
  Administered 2023-06-19 – 2023-06-20 (×2): 325 mg via ORAL
  Filled 2023-06-19 (×2): qty 1

## 2023-06-19 MED ORDER — POTASSIUM CHLORIDE CRYS ER 20 MEQ PO TBCR
40.0000 meq | EXTENDED_RELEASE_TABLET | Freq: Once | ORAL | Status: AC
  Administered 2023-06-19: 40 meq via ORAL
  Filled 2023-06-19: qty 2

## 2023-06-19 NOTE — TOC CM/SW Note (Signed)
Transition of Care [facility]) - Inpatient Brief Assessment   Patient Details  Name: Cheryl Daniels MRN: [id number] Date of Birth: 25-Jul-1980  Transition of [facility] CM/SW Contact:    Larrie Kass, LCSW Phone Number: 06/19/2023, 9:47 AM    Transition of Care Asessment: Insurance and Status: Insurance coverage has been reviewed Patient has primary care physician: Yes Home environment has been reviewed: home with self Prior level of function:: independent Prior/Current Home Services: No current home services Social Determinants of Health Reivew: SDOH reviewed no interventions necessary   Transition of care needs: no transition of care needs at this time

## 2023-06-19 NOTE — Progress Notes (Signed)
PROGRESS NOTE    Cheryl Daniels  ZOX:[id number] DOB: 03-07-1981 DOA: 06/16/2023 PCP: Murlean Caller, MD   Brief Narrative: 42 year old with past medical history significant for hypertension, asthma presented with diarrhea fever weakness.  Had temperature up to 102.  Symptoms started 6 days prior to admission.  Patient was found to have sepsis and increased upper radial gastroenteritis.  Symptoms have improved, he was started on Alinia.  Plan to monitor overnight because she report 1 tablespoon of bright red blood per rectum.    Assessment & Plan:   Principal Problem:   Sepsis (HCC) Active Problems:   Cryptosporidial gastroenteritis (HCC)   Chronic headache   Iron deficiency anemia due to chronic blood loss   Alcohol use   Folic acid deficiency   Prolonged QT interval   Hypokalemia   Tobacco abuse   Normocytic anemia   Hypophosphatemia   Hypomagnesemia  1-Sepsis: Present on admission, patient present with tachycardia, tachypnea, neutropenia.  CT abdomen and pelvis consistent with diarrheal illness.  Stool sample positive for Cryptosporidium Treated with IV fluids and antiparasitic  2-Cryptosporidia gastroenteritis Presented  with abdominal pain, CT abdomen and pelvis showed acute diarrheal illness, fevers. GI pathogen positive for Cryptosporidium This was discussed with ID and patient was started on Alinia for 3 days.   3-bright red blood in the stool: Repeated hemoglobin at 9.  Could be related to recent gastroenteritis versus hemorrhoid.  Plans to monitor overnight  Hypokalemia: Replace orally Hyponatremia: Resume IV fluids Iron deficiency anemia and folic acid deficiency: Started supplements  Estimated body mass index is 28.35 kg/m as calculated from the following:   Height as of this encounter: 5\' 3"  (1.6 m).   Weight as of this encounter: 72.6 kg.   DVT prophylaxis: SCDs Code Status: Full code Family Communication: Care discussed with patient Disposition Plan:   Status is: Inpatient Remains inpatient appropriate because: management of gastroenteritis.     Consultants:  Id Procedures:  none  Antimicrobials:    Subjective: She reports feeling better, diarrhea has improved, stool is getting more formed.  She did report her 1 teaspoon of blood mixed with stool today  Objective: Vitals:   06/18/23 0544 06/18/23 1305 06/18/23 2017 06/19/23 0443  BP: (!) 144/89 (!) 149/93 (!) 138/90 (!) 122/91  Pulse: 95 90 89 93  Resp: 19  20 19   Temp: 98.5 F (36.9 C) 98.3 F (36.8 C) 98.7 F (37.1 C) 98.2 F (36.8 C)  TempSrc: Oral Oral Oral Oral  SpO2: 100% 97% 100% 100%  Weight:      Height:        Intake/Output Summary (Last 24 hours) at 06/19/2023 1332 Last data filed at 06/19/2023 0930 Gross per 24 hour  Intake 480 ml  Output --  Net 480 ml   Filed Weights   06/16/23 1710  Weight: 72.6 kg    Examination:  General exam: Appears calm and comfortable  Respiratory system: Clear to auscultation. Respiratory effort normal. Cardiovascular system: S1 & S2 heard, RRR. No JVD, murmurs, rubs, gallops or clicks. No pedal edema. Gastrointestinal system: Abdomen is nondistended, soft and nontender. No organomegaly or masses felt. Normal bowel sounds heard. Central nervous system: Alert and oriented. No focal neurological deficits. Extremities: Symmetric 5 x 5 power.   Data Reviewed: I have personally reviewed following labs and imaging studies  CBC: Recent Labs  Lab 06/16/23 1800 06/16/23 1801 06/17/23 0350 06/18/23 0415 06/19/23 1029  WBC  --  2.9* 2.9* 3.6* 4.2  NEUTROABS  --  1.6* 1.3* 1.9  --   HGB 13.9 12.1 9.7* 8.3* 9.1*  HCT 41.0 36.1 29.5* 25.8* 28.6*  MCV  --  92.3 94.6 93.8 95.3  PLT  --  414* 356 325 430*   Basic Metabolic Panel: Recent Labs  Lab 06/16/23 1800 06/16/23 1801 06/17/23 0350 06/18/23 0415 06/19/23 1029  NA 134* 133* 135 136 133*  K 3.2* 3.1* 3.6 3.5 3.2*  CL 101 98 107 106 100  CO2  --  21* 24 24  26   GLUCOSE 121* 123* 97 98 124*  BUN 10 11 9 7 9   CREATININE 0.70 0.70 0.64 0.70 0.59  CALCIUM  --  9.0 8.1* 8.2* 8.4*  MG  --   --  1.5* 1.9  --   PHOS  --   --  1.9*  --   --    GFR: Estimated Creatinine Clearance: 89.3 mL/min (by C-G formula based on SCr of 0.59 mg/dL). Liver Function Tests: Recent Labs  Lab 06/16/23 1801 06/17/23 0350  AST 38 27  ALT 18 16  ALKPHOS 113 85  BILITOT 0.5 0.2  PROT 7.5 5.7*  ALBUMIN 3.8 3.0*   Recent Labs  Lab 06/16/23 1801  LIPASE 26   No results for input(s): "AMMONIA" in the last 168 hours. Coagulation Profile: Recent Labs  Lab 06/16/23 1858  INR 1.0   Cardiac Enzymes: Recent Labs  Lab 06/17/23 0351  CKTOTAL 59   BNP (last 3 results) No results for input(s): "PROBNP" in the last 8760 hours. HbA1C: No results for input(s): "HGBA1C" in the last 72 hours. CBG: No results for input(s): "GLUCAP" in the last 168 hours. Lipid Profile: No results for input(s): "CHOL", "HDL", "LDLCALC", "TRIG", "CHOLHDL", "LDLDIRECT" in the last 72 hours. Thyroid Function Tests: Recent Labs    06/17/23 0350  TSH 1.349   Anemia Panel: Recent Labs    06/17/23 0350  VITAMINB12 463  FOLATE 5.6*  FERRITIN 11  TIBC 301  IRON 15*  RETICCTPCT 2.8   Sepsis Labs: Recent Labs  Lab 06/16/23 1800 06/16/23 1935 06/16/23 2210 06/17/23 0351  PROCALCITON  --   --   --  0.12  LATICACIDVEN 2.1* 1.0 <0.3*  --     Recent Results (from the past 240 hour(s))  Resp panel by RT-PCR (RSV, Flu A&B, Covid) Anterior Nasal Swab     Status: None   Collection Time: 06/16/23  5:30 PM   Specimen: Anterior Nasal Swab  Result Value Ref Range Status   SARS Coronavirus 2 by RT PCR NEGATIVE NEGATIVE Final    Comment: (NOTE) SARS-CoV-2 target nucleic acids are NOT DETECTED.  The SARS-CoV-2 RNA is generally detectable in upper respiratory specimens during the acute phase of infection. The lowest concentration of SARS-CoV-2 viral copies this assay can detect  is 138 copies/mL. A negative result does not preclude SARS-Cov-2 infection and should not be used as the sole basis for treatment or other patient management decisions. A negative result may occur with  improper specimen collection/handling, submission of specimen other than nasopharyngeal swab, presence of viral mutation(s) within the areas targeted by this assay, and inadequate number of viral copies(<138 copies/mL). A negative result must be combined with clinical observations, patient history, and epidemiological information. The expected result is Negative.  Fact Sheet for Patients:  BloggerCourse.com  Fact Sheet for Healthcare Providers:  SeriousBroker.it  This test is no t yet approved or cleared by the [place] FDA and  has been authorized for detection and/or diagnosis of SARS-CoV-2  by FDA under an Emergency Use Authorization (EUA). This EUA will remain  in effect (meaning this test can be used) for the duration of the COVID-19 declaration under Section 564(b)(1) of the Act, 21 U.S.C.section 360bbb-3(b)(1), unless the authorization is terminated  or revoked sooner.       Influenza A by PCR NEGATIVE NEGATIVE Final   Influenza B by PCR NEGATIVE NEGATIVE Final    Comment: (NOTE) The Xpert Xpress SARS-CoV-2/FLU/RSV plus assay is intended as an aid in the diagnosis of influenza from Nasopharyngeal swab specimens and should not be used as a sole basis for treatment. Nasal washings and aspirates are unacceptable for Xpert Xpress SARS-CoV-2/FLU/RSV testing.  Fact Sheet for Patients: BloggerCourse.com  Fact Sheet for Healthcare Providers: SeriousBroker.it  This test is not yet approved or cleared by the [place] FDA and has been authorized for detection and/or diagnosis of SARS-CoV-2 by FDA under an Emergency Use Authorization (EUA). This EUA will remain in effect  (meaning this test can be used) for the duration of the COVID-19 declaration under Section 564(b)(1) of the Act, 21 U.S.C. section 360bbb-3(b)(1), unless the authorization is terminated or revoked.     Resp Syncytial Virus by PCR NEGATIVE NEGATIVE Final    Comment: (NOTE) Fact Sheet for Patients: BloggerCourse.com  Fact Sheet for Healthcare Providers: SeriousBroker.it  This test is not yet approved or cleared by the [place] FDA and has been authorized for detection and/or diagnosis of SARS-CoV-2 by FDA under an Emergency Use Authorization (EUA). This EUA will remain in effect (meaning this test can be used) for the duration of the COVID-19 declaration under Section 564(b)(1) of the Act, 21 U.S.C. section 360bbb-3(b)(1), unless the authorization is terminated or revoked.  Performed at [facility], [address], [place], [place] [address]   Respiratory (~20 pathogens) panel by PCR     Status: None   Collection Time: 06/16/23  5:30 PM   Specimen: Nasopharyngeal Swab; Respiratory  Result Value Ref Range Status   Adenovirus NOT DETECTED NOT DETECTED Final   Coronavirus 229E NOT DETECTED NOT DETECTED Final    Comment: (NOTE) The Coronavirus on the Respiratory Panel, DOES NOT test for the novel  Coronavirus (2019 nCoV)    Coronavirus HKU1 NOT DETECTED NOT DETECTED Final   Coronavirus NL63 NOT DETECTED NOT DETECTED Final   Coronavirus OC43 NOT DETECTED NOT DETECTED Final   Metapneumovirus NOT DETECTED NOT DETECTED Final   Rhinovirus / Enterovirus NOT DETECTED NOT DETECTED Final   Influenza A NOT DETECTED NOT DETECTED Final   Influenza B NOT DETECTED NOT DETECTED Final   Parainfluenza Virus 1 NOT DETECTED NOT DETECTED Final   Parainfluenza Virus 2 NOT DETECTED NOT DETECTED Final   Parainfluenza Virus 3 NOT DETECTED NOT DETECTED Final   Parainfluenza Virus 4 NOT DETECTED NOT DETECTED Final   Respiratory  Syncytial Virus NOT DETECTED NOT DETECTED Final   Bordetella pertussis NOT DETECTED NOT DETECTED Final   Bordetella Parapertussis NOT DETECTED NOT DETECTED Final   Chlamydophila pneumoniae NOT DETECTED NOT DETECTED Final   Mycoplasma pneumoniae NOT DETECTED NOT DETECTED Final    Comment: Performed at [facility], [address], [place], [place] [address]  Blood Culture (routine x 2)     Status: None (Preliminary result)   Collection Time: 06/16/23  6:11 PM   Specimen: BLOOD  Result Value Ref Range Status   Specimen Description   Final    BLOOD LEFT ANTECUBITAL Performed at [facility], [address] [place], [place] [address]    Special Requests   Final    BOTTLES DRAWN AEROBIC AND ANAEROBIC Blood Culture adequate volume Performed at [facility], [address] [place], [place] [address]    Culture   Final    NO GROWTH 3 DAYS Performed at [facility], [address] [place], [place] [address]    Report Status PENDING  Incomplete  Blood Culture (routine x 2)     Status: None (Preliminary result)   Collection Time: 06/16/23  6:12 PM   Specimen: BLOOD  Result Value Ref Range Status   Specimen Description   Final    BLOOD RIGHT ANTECUBITAL Performed at [facility], [address], [place], [place] [address]    Special Requests   Final    BOTTLES DRAWN AEROBIC AND ANAEROBIC Blood Culture adequate volume Performed at [facility], [address] [place], [place] [address]    Culture   Final    NO GROWTH 3 DAYS Performed at [facility], [address] [place], [place] [address]    Report Status PENDING  Incomplete  Urine Culture     Status: Abnormal   Collection Time: 06/16/23  9:08 PM   Specimen: Urine, Clean Catch  Result Value Ref Range Status   Specimen Description   Final    URINE, CLEAN CATCH Performed at [facility], [address],  [place], [place] [address]    Special Requests   Final    URINE, CLEAN CATCH Performed at [facility], [address] [place], [place] [address]    Culture MULTIPLE SPECIES PRESENT, SUGGEST RECOLLECTION (A)  Final   Report Status 06/18/2023 FINAL  Final  C Difficile Quick Screen w PCR reflex     Status: None   Collection Time: 06/17/23  1:05 PM  Result Value Ref Range Status   C Diff antigen NEGATIVE NEGATIVE Final   C Diff toxin NEGATIVE NEGATIVE Final   C Diff interpretation No C. difficile detected.  Final    Comment: Performed at [facility], [address] [place], [place] [address]  Gastrointestinal Panel by PCR , Stool     Status: Abnormal   Collection Time: 06/17/23  1:05 PM  Result Value Ref Range Status   Campylobacter species NOT DETECTED NOT DETECTED Final   Plesimonas shigelloides NOT DETECTED NOT DETECTED Final   Salmonella species NOT DETECTED NOT DETECTED Final   Yersinia enterocolitica NOT DETECTED NOT DETECTED Final   Vibrio species NOT DETECTED NOT DETECTED Final   Vibrio cholerae NOT DETECTED NOT DETECTED Final   Enteroaggregative E coli (EAEC) NOT DETECTED NOT DETECTED Final   Enteropathogenic E coli (EPEC) NOT DETECTED NOT DETECTED Final   Enterotoxigenic E coli (ETEC) NOT DETECTED NOT DETECTED Final   Shiga like toxin producing E coli (STEC) NOT DETECTED NOT DETECTED Final   Shigella/Enteroinvasive E coli (EIEC) NOT DETECTED NOT DETECTED Final   Cryptosporidium DETECTED (A) NOT DETECTED Final   Cyclospora cayetanensis NOT DETECTED NOT DETECTED Final   Entamoeba histolytica NOT DETECTED NOT DETECTED Final   Giardia lamblia NOT DETECTED NOT DETECTED Final   Adenovirus F40/41 NOT DETECTED NOT DETECTED Final   Astrovirus NOT DETECTED NOT DETECTED Final   Norovirus GI/GII NOT DETECTED NOT DETECTED Final   Rotavirus A NOT DETECTED NOT DETECTED Final   Sapovirus (I, II, IV, and V) NOT DETECTED NOT DETECTED Final  Comment:  Performed at [facility], [address] [place], [place] [address]         Radiology Studies: No results found.      Scheduled Meds:  dicyclomine  10 mg Oral TID AC & HS   folic acid  1 mg Oral Daily   nitazoxanide  500 mg Oral Q12H   potassium chloride  40 mEq Oral Once   Continuous Infusions:  sodium chloride 75 mL/hr at 06/19/23 1211     LOS: 2 days    Time spent: 35 minutes    Tamerra Merkley A Ahmeer Tuman, MD [facility]   If 7PM-7AM, please contact night-coverage [email or web address]  06/19/2023, 1:32 PM

## 2023-06-20 DIAGNOSIS — A419 Sepsis, unspecified organism: Secondary | ICD-10-CM | POA: Diagnosis not present

## 2023-06-20 LAB — CBC
HCT: 25.1 % — ABNORMAL LOW (ref 36.0–46.0)
HCT: 25.9 % — ABNORMAL LOW (ref 36.0–46.0)
Hemoglobin: 8 g/dL — ABNORMAL LOW (ref 12.0–15.0)
Hemoglobin: 8.1 g/dL — ABNORMAL LOW (ref 12.0–15.0)
MCH: 30.1 pg (ref 26.0–34.0)
MCH: 30.6 pg (ref 26.0–34.0)
MCHC: 30.9 g/dL (ref 30.0–36.0)
MCHC: 32.3 g/dL (ref 30.0–36.0)
MCV: 94.7 fL (ref 80.0–100.0)
MCV: 97.4 fL (ref 80.0–100.0)
Platelets: 369 10*3/uL (ref 150–400)
Platelets: 387 10*3/uL (ref 150–400)
RBC: 2.65 MIL/uL — ABNORMAL LOW (ref 3.87–5.11)
RBC: 2.66 MIL/uL — ABNORMAL LOW (ref 3.87–5.11)
RDW: 15.3 % (ref 11.5–15.5)
RDW: 15.4 % (ref 11.5–15.5)
WBC: 4.1 10*3/uL (ref 4.0–10.5)
WBC: 4.4 10*3/uL (ref 4.0–10.5)
nRBC: 0 % (ref 0.0–0.2)
nRBC: 0.5 % — ABNORMAL HIGH (ref 0.0–0.2)

## 2023-06-20 MED ORDER — FOLIC ACID 1 MG PO TABS: 1.0000 mg | ORAL_TABLET | Freq: Every day | ORAL | 0 refills | Status: DC

## 2023-06-20 MED ORDER — DICYCLOMINE HCL 10 MG PO CAPS: 10.0000 mg | ORAL_CAPSULE | Freq: Three times a day (TID) | ORAL | 0 refills | Status: DC | PRN

## 2023-06-20 MED ORDER — AMLODIPINE BESYLATE 10 MG PO TABS: 10.0000 mg | ORAL_TABLET | Freq: Every day | ORAL | 2 refills | Status: DC

## 2023-06-20 MED ORDER — FERROUS SULFATE 325 (65 FE) MG PO TABS: 325.0000 mg | ORAL_TABLET | Freq: Every day | ORAL | 3 refills | Status: DC

## 2023-06-20 MED ORDER — NITAZOXANIDE 500 MG PO TABS: 500.0000 mg | ORAL_TABLET | Freq: Two times a day (BID) | ORAL | 0 refills | Status: AC

## 2023-06-20 MED ORDER — HYDROCODONE-ACETAMINOPHEN 5-325 MG PO TABS: 1.0000 | ORAL_TABLET | Freq: Four times a day (QID) | ORAL | 0 refills | Status: AC | PRN

## 2023-06-20 NOTE — Progress Notes (Addendum)
AVS reviewed w/pt who verbalized an understanding- no other questions at this time. PIV removed as noted- pt dressing for d/c to home. Husband enroute- Tele removed by primary nurse

## 2023-06-20 NOTE — Discharge Instructions (Signed)
Ask your Doctor to check your Vitamin D level and IgA level.

## 2023-06-20 NOTE — Discharge Summary (Signed)
Physician Discharge Summary   Patient: Cheryl Daniels MRN: [id number] DOB: 1980-12-22  Admit date:     06/16/2023  Discharge date: 06/20/23  Discharge Physician: Alba Cory   PCP: Murlean Caller, MD   Recommendations at discharge:    Needs Vitamin D level and IgA level.  Further evaluation out patient  for iron deficiency anemia.   Discharge Diagnoses: Principal Problem:   Sepsis (HCC) Active Problems:   Cryptosporidial gastroenteritis (HCC)   Chronic headache   Iron deficiency anemia due to chronic blood loss   Alcohol use   Folic acid deficiency   Prolonged QT interval   Hypokalemia   Tobacco abuse   Normocytic anemia   Hypophosphatemia   Hypomagnesemia  Resolved Problems:   * No resolved hospital problems. *  Hospital Course: 42 year old with past medical history significant for hypertension, asthma presented with diarrhea fever weakness.  Had temperature up to 102.  Symptoms started 6 days prior to admission.  Patient was found to have sepsis and increased upper radial gastroenteritis.  Symptoms have improved, he was started on Alinia.  Plan to monitor overnight because she report 1 tablespoon of bright red blood per rectum. No further bleeding. Hb stable. Diarrhea resolved.   Assessment and Plan: 1-Sepsis: Present on admission, patient present with tachycardia, tachypnea, neutropenia.  CT abdomen and pelvis consistent with diarrheal illness.  Stool sample positive for Cryptosporidium Treated with IV fluids and antiparasitic   2-Cryptosporidia gastroenteritis Presented  with abdominal pain, CT abdomen and pelvis showed acute diarrheal illness, fevers. GI pathogen positive for Cryptosporidium This was discussed with ID and patient was started on Alinia for 3 days.   Would recommend checking for IgA deficiency.   3-bright red blood in the stool: Repeated hemoglobin at 9.  Could be related to recent gastroenteritis versus hemorrhoid.  No further episodes. Hb  stable.    Hypokalemia: Replaced  Hyponatremia: Resume IV fluids Iron deficiency anemia and folic acid deficiency: Started supplements   Estimated body mass index is 28.35 kg/m as calculated from the following:   Height as of this encounter: 5\' 3"  (1.6 m).   Weight as of this encounter: 72.6 kg.          Consultants: ID Phone Procedures performed: None Disposition: Home Diet recommendation:  Discharge Diet Orders (From admission, onward)     Start     Ordered   06/20/23 0000  Diet - low sodium heart healthy        06/20/23 1008           Regular diet DISCHARGE MEDICATION: Allergies as of 06/20/2023   No Known Allergies      Medication List     STOP taking these medications    ibuprofen 200 MG tablet Commonly known as: ADVIL       TAKE these medications    amLODipine 10 MG tablet Commonly known as: NORVASC Take 1 tablet (10 mg total) by mouth daily.   dicyclomine 10 MG capsule Commonly known as: BENTYL Take 1 capsule (10 mg total) by mouth 3 (three) times daily between meals as needed for spasms.   diphenhydrAMINE 25 MG tablet Commonly known as: BENADRYL Take 25 mg by mouth at bedtime as needed for allergies.   ferrous sulfate 325 (65 FE) MG tablet Take 1 tablet (325 mg total) by mouth daily with breakfast.   folic acid 1 MG tablet Commonly known as: FOLVITE Take 1 tablet (1 mg total) by mouth daily.   nitazoxanide 500 MG  tablet Commonly known as: ALINIA Take 1 tablet (500 mg total) by mouth every 12 (twelve) hours for 2 doses.        Discharge Exam: Filed Weights   06/16/23 1710  Weight: 72.6 kg   General; NAD Condition at discharge: stable  The results of significant diagnostics from this hospitalization (including imaging, microbiology, ancillary and laboratory) are listed below for reference.   Imaging Studies: CT ABDOMEN PELVIS W CONTRAST  Result Date: 06/16/2023 CLINICAL DATA:  Acute abdominal pain EXAM: CT ABDOMEN AND  PELVIS WITH CONTRAST TECHNIQUE: Multidetector CT imaging of the abdomen and pelvis was performed using the standard protocol following bolus administration of intravenous contrast. RADIATION DOSE REDUCTION: This exam was performed according to the departmental dose-optimization program which includes automated exposure control, adjustment of the mA and/or kV according to patient size and/or use of iterative reconstruction technique. CONTRAST:  OMNIPAQUE IOHEXOL 300 MG/ML  SOLN COMPARISON:  None FINDINGS: Lower chest: Clear. Hepatobiliary: There is diffuse fatty infiltration of the liver. Gallbladder and bile ducts are within normal limits. Pancreas: Unremarkable. No pancreatic ductal dilatation or surrounding inflammatory changes. Spleen: Normal in size without focal abnormality. Adrenals/Urinary Tract: Adrenal glands are unremarkable. Kidneys are normal, without renal calculi, focal lesion, or hydronephrosis. Bladder is unremarkable. Stomach/Bowel: There is a small hiatal hernia. Stomach is within normal limits. Appendix appears normal. No evidence of bowel wall thickening, distention, or inflammatory changes. There are air-fluid levels throughout the colon. Vascular/Lymphatic: No significant vascular findings are present. No enlarged abdominal or pelvic lymph nodes. Reproductive: Least 2 uterine fibroids are present measuring up to 2.5 cm. Ovaries are within normal limits. Other: No abdominal wall hernia or abnormality. No abdominopelvic ascites. Musculoskeletal: No acute osseous abnormality. Likely prominent Schmorl's nodes lungs superior endplates of L2 and L3 versus old mild compression fractures. IMPRESSION: 1. Air-fluid levels throughout the colon compatible with diarrheal illness. 2. Fatty infiltration of the liver. 3. Small hiatal hernia. 4. Uterine fibroids. Electronically Signed   By: Darliss Cheney M.D.   On: 06/16/2023 22:49   DG Chest Port 1 View  Result Date: 06/16/2023 CLINICAL DATA:  Fever  tachycardia EXAM: PORTABLE CHEST 1 VIEW COMPARISON:  06/16/2021 FINDINGS: The heart size and mediastinal contours are within normal limits. Both lungs are clear. The visualized skeletal structures are unremarkable. IMPRESSION: No active disease. Electronically Signed   By: Jasmine Pang M.D.   On: 06/16/2023 18:42    Microbiology: Results for orders placed or performed during the hospital encounter of 06/16/23  Resp panel by RT-PCR (RSV, Flu A&B, Covid) Anterior Nasal Swab     Status: None   Collection Time: 06/16/23  5:30 PM   Specimen: Anterior Nasal Swab  Result Value Ref Range Status   SARS Coronavirus 2 by RT PCR NEGATIVE NEGATIVE Final    Comment: (NOTE) SARS-CoV-2 target nucleic acids are NOT DETECTED.  The SARS-CoV-2 RNA is generally detectable in upper respiratory specimens during the acute phase of infection. The lowest concentration of SARS-CoV-2 viral copies this assay can detect is 138 copies/mL. A negative result does not preclude SARS-Cov-2 infection and should not be used as the sole basis for treatment or other patient management decisions. A negative result may occur with  improper specimen collection/handling, submission of specimen other than nasopharyngeal swab, presence of viral mutation(s) within the areas targeted by this assay, and inadequate number of viral copies(<138 copies/mL). A negative result must be combined with clinical observations, patient history, and epidemiological information. The expected result is Negative.  Fact Sheet for Patients:  BloggerCourse.com  Fact Sheet for Healthcare Providers:  SeriousBroker.it  This test is no t yet approved or cleared by the [place] FDA and  has been authorized for detection and/or diagnosis of SARS-CoV-2 by FDA under an Emergency Use Authorization (EUA). This EUA will remain  in effect (meaning this test can be used) for the duration of the COVID-19  declaration under Section 564(b)(1) of the Act, 21 U.S.C.section 360bbb-3(b)(1), unless the authorization is terminated  or revoked sooner.       Influenza A by PCR NEGATIVE NEGATIVE Final   Influenza B by PCR NEGATIVE NEGATIVE Final    Comment: (NOTE) The Xpert Xpress SARS-CoV-2/FLU/RSV plus assay is intended as an aid in the diagnosis of influenza from Nasopharyngeal swab specimens and should not be used as a sole basis for treatment. Nasal washings and aspirates are unacceptable for Xpert Xpress SARS-CoV-2/FLU/RSV testing.  Fact Sheet for Patients: BloggerCourse.com  Fact Sheet for Healthcare Providers: SeriousBroker.it  This test is not yet approved or cleared by the [place] FDA and has been authorized for detection and/or diagnosis of SARS-CoV-2 by FDA under an Emergency Use Authorization (EUA). This EUA will remain in effect (meaning this test can be used) for the duration of the COVID-19 declaration under Section 564(b)(1) of the Act, 21 U.S.C. section 360bbb-3(b)(1), unless the authorization is terminated or revoked.     Resp Syncytial Virus by PCR NEGATIVE NEGATIVE Final    Comment: (NOTE) Fact Sheet for Patients: BloggerCourse.com  Fact Sheet for Healthcare Providers: SeriousBroker.it  This test is not yet approved or cleared by the [place] FDA and has been authorized for detection and/or diagnosis of SARS-CoV-2 by FDA under an Emergency Use Authorization (EUA). This EUA will remain in effect (meaning this test can be used) for the duration of the COVID-19 declaration under Section 564(b)(1) of the Act, 21 U.S.C. section 360bbb-3(b)(1), unless the authorization is terminated or revoked.  Performed at [facility], [address] [place], [place] [address]   Respiratory (~20 pathogens) panel by PCR     Status: None   Collection  Time: 06/16/23  5:30 PM   Specimen: Nasopharyngeal Swab; Respiratory  Result Value Ref Range Status   Adenovirus NOT DETECTED NOT DETECTED Final   Coronavirus 229E NOT DETECTED NOT DETECTED Final    Comment: (NOTE) The Coronavirus on the Respiratory Panel, DOES NOT test for the novel  Coronavirus (2019 nCoV)    Coronavirus HKU1 NOT DETECTED NOT DETECTED Final   Coronavirus NL63 NOT DETECTED NOT DETECTED Final   Coronavirus OC43 NOT DETECTED NOT DETECTED Final   Metapneumovirus NOT DETECTED NOT DETECTED Final   Rhinovirus / Enterovirus NOT DETECTED NOT DETECTED Final   Influenza A NOT DETECTED NOT DETECTED Final   Influenza B NOT DETECTED NOT DETECTED Final   Parainfluenza Virus 1 NOT DETECTED NOT DETECTED Final   Parainfluenza Virus 2 NOT DETECTED NOT DETECTED Final   Parainfluenza Virus 3 NOT DETECTED NOT DETECTED Final   Parainfluenza Virus 4 NOT DETECTED NOT DETECTED Final   Respiratory Syncytial Virus NOT DETECTED NOT DETECTED Final   Bordetella pertussis NOT DETECTED NOT DETECTED Final   Bordetella Parapertussis NOT DETECTED NOT DETECTED Final   Chlamydophila pneumoniae NOT DETECTED NOT DETECTED Final   Mycoplasma pneumoniae NOT DETECTED NOT DETECTED Final    Comment: Performed at [facility], [address] [place], [place] [address]  Blood Culture (routine x 2)     Status: None (  Preliminary result)   Collection Time: 06/16/23  6:11 PM   Specimen: BLOOD  Result Value Ref Range Status   Specimen Description   Final    BLOOD LEFT ANTECUBITAL Performed at [facility], [address] [place], [place] [address]    Special Requests   Final    BOTTLES DRAWN AEROBIC AND ANAEROBIC Blood Culture adequate volume Performed at [facility], [address], [place], [place] [address]    Culture   Final    NO GROWTH 4 DAYS Performed at [facility], [address], [place], [place] [address]    Report Status PENDING  Incomplete   Blood Culture (routine x 2)     Status: None (Preliminary result)   Collection Time: 06/16/23  6:12 PM   Specimen: BLOOD  Result Value Ref Range Status   Specimen Description   Final    BLOOD RIGHT ANTECUBITAL Performed at [facility], [address] [place], [place] [address]    Special Requests   Final    BOTTLES DRAWN AEROBIC AND ANAEROBIC Blood Culture adequate volume Performed at [facility], [address] [place], [place] [address]    Culture   Final    NO GROWTH 4 DAYS Performed at [facility], [address], [place], [place] [address]    Report Status PENDING  Incomplete  Urine Culture     Status: Abnormal   Collection Time: 06/16/23  9:08 PM   Specimen: Urine, Clean Catch  Result Value Ref Range Status   Specimen Description   Final    URINE, CLEAN CATCH Performed at [facility], [address], [place], [place] [address]    Special Requests   Final    URINE, CLEAN CATCH Performed at [facility], [address], [place], [place] [address]    Culture MULTIPLE SPECIES PRESENT, SUGGEST RECOLLECTION (A)  Final   Report Status 06/18/2023 FINAL  Final  C Difficile Quick Screen w PCR reflex     Status: None   Collection Time: 06/17/23  1:05 PM  Result Value Ref Range Status   C Diff antigen NEGATIVE NEGATIVE Final   C Diff toxin NEGATIVE NEGATIVE Final   C Diff interpretation No C. difficile detected.  Final    Comment: Performed at [facility], [address] [place], [place] [address]  Gastrointestinal Panel by PCR , Stool     Status: Abnormal   Collection Time: 06/17/23  1:05 PM  Result Value Ref Range Status   Campylobacter species NOT DETECTED NOT DETECTED Final   Plesimonas shigelloides NOT DETECTED NOT DETECTED Final   Salmonella species NOT DETECTED NOT DETECTED Final   Yersinia enterocolitica NOT DETECTED NOT DETECTED Final   Vibrio species NOT  DETECTED NOT DETECTED Final   Vibrio cholerae NOT DETECTED NOT DETECTED Final   Enteroaggregative E coli (EAEC) NOT DETECTED NOT DETECTED Final   Enteropathogenic E coli (EPEC) NOT DETECTED NOT DETECTED Final   Enterotoxigenic E coli (ETEC) NOT DETECTED NOT DETECTED Final   Shiga like toxin producing E coli (STEC) NOT DETECTED NOT DETECTED Final   Shigella/Enteroinvasive E coli (EIEC) NOT DETECTED NOT DETECTED Final   Cryptosporidium DETECTED (A) NOT DETECTED Final   Cyclospora cayetanensis NOT DETECTED NOT DETECTED Final   Entamoeba histolytica NOT DETECTED NOT DETECTED Final   Giardia lamblia NOT DETECTED NOT DETECTED Final   Adenovirus F40/41 NOT DETECTED NOT DETECTED Final  Astrovirus NOT DETECTED NOT DETECTED Final   Norovirus GI/GII NOT DETECTED NOT DETECTED Final   Rotavirus A NOT DETECTED NOT DETECTED Final   Sapovirus (I, II, IV, and V) NOT DETECTED NOT DETECTED Final    Comment: Performed at [facility] [place], [address] [place], [place] [address]    Labs: CBC: Recent Labs  Lab 06/16/23 1801 06/17/23 0350 06/18/23 0415 06/19/23 1029 06/19/23 1602 06/19/23 2349 06/20/23 0431  WBC 2.9* 2.9* 3.6* 4.2 4.9 4.4 4.1  NEUTROABS 1.6* 1.3* 1.9  --   --   --   --   HGB 12.1 9.7* 8.3* 9.1* 9.2* 8.1* 8.0*  HCT 36.1 29.5* 25.8* 28.6* 29.0* 25.1* 25.9*  MCV 92.3 94.6 93.8 95.3 95.7 94.7 97.4  PLT 414* 356 325 430* 445* 369 387   Basic Metabolic Panel: Recent Labs  Lab 06/16/23 1800 06/16/23 1801 06/17/23 0350 06/18/23 0415 06/19/23 1029  NA 134* 133* 135 136 133*  K 3.2* 3.1* 3.6 3.5 3.2*  CL 101 98 107 106 100  CO2  --  21* 24 24 26   GLUCOSE 121* 123* 97 98 124*  BUN 10 11 9 7 9   CREATININE 0.70 0.70 0.64 0.70 0.59  CALCIUM  --  9.0 8.1* 8.2* 8.4*  MG  --   --  1.5* 1.9  --   PHOS  --   --  1.9*  --   --    Liver Function Tests: Recent Labs  Lab 06/16/23 1801 06/17/23 0350  AST 38 27  ALT 18 16  ALKPHOS 113 85  BILITOT 0.5 0.2  PROT 7.5 5.7*   ALBUMIN 3.8 3.0*   CBG: No results for input(s): "GLUCAP" in the last 168 hours.  Discharge time spent: greater than 30 minutes.  Signed: Alba Cory, MD [facility] 06/20/2023

## 2023-06-20 NOTE — Plan of Care (Signed)
  Problem: Clinical Measurements: Goal: Signs and symptoms of infection will decrease Outcome: Progressing   Problem: Education: Goal: Knowledge of General Education information will improve Description: Including pain rating scale, medication(s)/side effects and non-pharmacologic comfort measures Outcome: Progressing   Problem: Coping: Goal: Level of anxiety will decrease Outcome: Progressing   Problem: Pain Management: Goal: General experience of comfort will improve Outcome: Progressing   Problem: Activity: Goal: Risk for activity intolerance will decrease Outcome: Adequate for Discharge

## 2023-06-21 LAB — CULTURE, BLOOD (ROUTINE X 2)
Culture: NO GROWTH
Culture: NO GROWTH
Special Requests: ADEQUATE
Special Requests: ADEQUATE

## 2023-08-21 ENCOUNTER — Emergency Department (HOSPITAL_COMMUNITY): Payer: Medicaid Other

## 2023-08-21 ENCOUNTER — Other Ambulatory Visit: Payer: Self-pay

## 2023-08-21 ENCOUNTER — Inpatient Hospital Stay (HOSPITAL_COMMUNITY)
Admission: EM | Admit: 2023-08-21 | Discharge: 2023-08-26 | DRG: 392 | Disposition: A | Discharge status: Home / Self Care | Payer: Medicaid Other | Attending: Internal Medicine | Admitting: Internal Medicine

## 2023-08-21 ENCOUNTER — Encounter (HOSPITAL_COMMUNITY): Payer: Self-pay

## 2023-08-21 DIAGNOSIS — K529 Noninfective gastroenteritis and colitis, unspecified: Principal | ICD-10-CM | POA: Diagnosis present

## 2023-08-21 DIAGNOSIS — R824 Acetonuria: Secondary | ICD-10-CM | POA: Diagnosis present

## 2023-08-21 DIAGNOSIS — R823 Hemoglobinuria: Secondary | ICD-10-CM | POA: Diagnosis present

## 2023-08-21 DIAGNOSIS — E872 Acidosis, unspecified: Secondary | ICD-10-CM | POA: Diagnosis not present

## 2023-08-21 DIAGNOSIS — R809 Proteinuria, unspecified: Secondary | ICD-10-CM | POA: Diagnosis present

## 2023-08-21 DIAGNOSIS — A084 Viral intestinal infection, unspecified: Principal | ICD-10-CM | POA: Diagnosis present

## 2023-08-21 DIAGNOSIS — Z8249 Family history of ischemic heart disease and other diseases of the circulatory system: Secondary | ICD-10-CM

## 2023-08-21 DIAGNOSIS — E538 Deficiency of other specified B group vitamins: Secondary | ICD-10-CM | POA: Diagnosis present

## 2023-08-21 DIAGNOSIS — I10 Essential (primary) hypertension: Secondary | ICD-10-CM | POA: Diagnosis present

## 2023-08-21 DIAGNOSIS — R81 Glycosuria: Secondary | ICD-10-CM | POA: Diagnosis present

## 2023-08-21 DIAGNOSIS — D509 Iron deficiency anemia, unspecified: Secondary | ICD-10-CM | POA: Diagnosis present

## 2023-08-21 DIAGNOSIS — E876 Hypokalemia: Secondary | ICD-10-CM | POA: Diagnosis not present

## 2023-08-21 DIAGNOSIS — Z87891 Personal history of nicotine dependence: Secondary | ICD-10-CM

## 2023-08-21 DIAGNOSIS — E860 Dehydration: Secondary | ICD-10-CM | POA: Diagnosis present

## 2023-08-21 DIAGNOSIS — R102 Pelvic and perineal pain: Secondary | ICD-10-CM | POA: Diagnosis present

## 2023-08-21 DIAGNOSIS — Z1152 Encounter for screening for COVID-19: Secondary | ICD-10-CM

## 2023-08-21 DIAGNOSIS — Z79899 Other long term (current) drug therapy: Secondary | ICD-10-CM

## 2023-08-21 LAB — CBC WITH DIFFERENTIAL/PLATELET
Abs Immature Granulocytes: 0.02 10*3/uL (ref 0.00–0.07)
Basophils Absolute: 0 10*3/uL (ref 0.0–0.1)
Basophils Relative: 0 %
Eosinophils Absolute: 0 10*3/uL (ref 0.0–0.5)
Eosinophils Relative: 0 %
HCT: 37.2 % (ref 36.0–46.0)
Hemoglobin: 11.4 g/dL — ABNORMAL LOW (ref 12.0–15.0)
Immature Granulocytes: 0 %
Lymphocytes Relative: 15 %
Lymphs Abs: 1 10*3/uL (ref 0.7–4.0)
MCH: 24.7 pg — ABNORMAL LOW (ref 26.0–34.0)
MCHC: 30.6 g/dL (ref 30.0–36.0)
MCV: 80.7 fL (ref 80.0–100.0)
Monocytes Absolute: 0.4 10*3/uL (ref 0.1–1.0)
Monocytes Relative: 5 %
Neutro Abs: 5.4 10*3/uL (ref 1.7–7.7)
Neutrophils Relative %: 80 %
Platelets: 509 10*3/uL — ABNORMAL HIGH (ref 150–400)
RBC: 4.61 MIL/uL (ref 3.87–5.11)
RDW: 19.7 % — ABNORMAL HIGH (ref 11.5–15.5)
WBC: 6.8 10*3/uL (ref 4.0–10.5)
nRBC: 0 % (ref 0.0–0.2)

## 2023-08-21 LAB — URINALYSIS, ROUTINE W REFLEX MICROSCOPIC
Bacteria, UA: NONE SEEN
Bilirubin Urine: NEGATIVE
Glucose, UA: 50 mg/dL — AB
Ketones, ur: 5 mg/dL — AB
Leukocytes,Ua: NEGATIVE
Nitrite: NEGATIVE
Protein, ur: 100 mg/dL — AB
Specific Gravity, Urine: 1.027 (ref 1.005–1.030)
pH: 5 (ref 5.0–8.0)

## 2023-08-21 LAB — URINALYSIS, W/ REFLEX TO CULTURE (INFECTION SUSPECTED)
Bacteria, UA: NONE SEEN
Bilirubin Urine: NEGATIVE
Glucose, UA: NEGATIVE mg/dL
Ketones, ur: NEGATIVE mg/dL
Leukocytes,Ua: NEGATIVE
Nitrite: NEGATIVE
Protein, ur: 100 mg/dL — AB
RBC / HPF: >50 RBC/hpf (ref 0–5)
pH: 6 (ref 5.0–8.0)

## 2023-08-21 LAB — COMPREHENSIVE METABOLIC PANEL
ALT: 18 U/L (ref 0–44)
AST: 31 U/L (ref 15–41)
Albumin: 4.3 g/dL (ref 3.5–5.0)
Alkaline Phosphatase: 99 U/L (ref 38–126)
Anion gap: 17 — ABNORMAL HIGH (ref 5–15)
BUN: 11 mg/dL (ref 6–20)
CO2: 21 mmol/L — ABNORMAL LOW (ref 22–32)
Calcium: 8.8 mg/dL — ABNORMAL LOW (ref 8.9–10.3)
Chloride: 101 mmol/L (ref 98–111)
Creatinine, Ser: 0.64 mg/dL (ref 0.44–1.00)
GFR, Estimated: >60 mL/min (ref >60)
Glucose, Bld: 112 mg/dL — ABNORMAL HIGH (ref 70–99)
Potassium: 2.9 mmol/L — ABNORMAL LOW (ref 3.5–5.1)
Sodium: 139 mmol/L (ref 135–145)
Total Bilirubin: 0.6 mg/dL (ref 0.0–1.2)
Total Protein: 7.6 g/dL (ref 6.5–8.1)

## 2023-08-21 LAB — I-STAT CG4 LACTIC ACID, ED
Lactic Acid, Venous: 3.5 mmol/L (ref 0.5–1.9)
Lactic Acid, Venous: 2.1 mmol/L (ref 0.5–1.9)
Lactic Acid, Venous: 3.6 mmol/L (ref 0.5–1.9)

## 2023-08-21 LAB — RESP PANEL BY RT-PCR (RSV, FLU A&B, COVID)  RVPGX2
Influenza A by PCR: NEGATIVE
Influenza B by PCR: NEGATIVE
Resp Syncytial Virus by PCR: NEGATIVE
SARS Coronavirus 2 by RT PCR: NEGATIVE

## 2023-08-21 LAB — LIPASE, BLOOD: Lipase: 26 U/L (ref 11–51)

## 2023-08-21 LAB — PROTIME-INR
INR: 1 (ref 0.8–1.2)
Prothrombin Time: 13.6 s (ref 11.4–15.2)

## 2023-08-21 LAB — HCG, SERUM, QUALITATIVE: Preg, Serum: NEGATIVE

## 2023-08-21 MED ORDER — LACTATED RINGERS IV BOLUS (SEPSIS)
1000.0000 mL | Freq: Once | INTRAVENOUS | Status: AC
  Administered 2023-08-21: 1000 mL via INTRAVENOUS

## 2023-08-21 MED ORDER — SENNOSIDES-DOCUSATE SODIUM 8.6-50 MG PO TABS: 1.0000 | ORAL_TABLET | Freq: Every evening | ORAL | Status: DC | PRN

## 2023-08-21 MED ORDER — POTASSIUM CHLORIDE CRYS ER 20 MEQ PO TBCR
40.0000 meq | EXTENDED_RELEASE_TABLET | Freq: Once | ORAL | Status: AC
  Administered 2023-08-21: 40 meq via ORAL
  Filled 2023-08-21: qty 2

## 2023-08-21 MED ORDER — LACTATED RINGERS IV SOLN: INTRAVENOUS | Status: AC

## 2023-08-21 MED ORDER — ONDANSETRON 4 MG PO TBDP
4.0000 mg | ORAL_TABLET | Freq: Once | ORAL | Status: AC
  Administered 2023-08-21: 4 mg via ORAL
  Filled 2023-08-21: qty 1

## 2023-08-21 MED ORDER — PROCHLORPERAZINE EDISYLATE 10 MG/2ML IJ SOLN
10.0000 mg | Freq: Once | INTRAMUSCULAR | Status: AC
  Administered 2023-08-21: 10 mg via INTRAVENOUS
  Filled 2023-08-21: qty 2

## 2023-08-21 MED ORDER — KETOROLAC TROMETHAMINE 15 MG/ML IJ SOLN
15.0000 mg | Freq: Once | INTRAMUSCULAR | Status: AC
  Administered 2023-08-21: 15 mg via INTRAVENOUS
  Filled 2023-08-21: qty 1

## 2023-08-21 MED ORDER — ACETAMINOPHEN 325 MG PO TABS
650.0000 mg | ORAL_TABLET | Freq: Once | ORAL | Status: AC
  Administered 2023-08-21: 650 mg via ORAL
  Filled 2023-08-21: qty 2

## 2023-08-21 MED ORDER — SODIUM CHLORIDE 0.9 % IV SOLN
2.0000 g | Freq: Once | INTRAVENOUS | Status: AC
  Administered 2023-08-21: 2 g via INTRAVENOUS
  Filled 2023-08-21: qty 20

## 2023-08-21 MED ORDER — ACETAMINOPHEN 650 MG RE SUPP: 650.0000 mg | Freq: Four times a day (QID) | RECTAL | Status: DC | PRN

## 2023-08-21 MED ORDER — ACETAMINOPHEN 325 MG PO TABS
650.0000 mg | ORAL_TABLET | Freq: Four times a day (QID) | ORAL | Status: DC | PRN
  Administered 2023-08-22: 650 mg via ORAL
  Filled 2023-08-21: qty 2

## 2023-08-21 MED ORDER — ONDANSETRON HCL 4 MG PO TABS: 4.0000 mg | ORAL_TABLET | Freq: Four times a day (QID) | ORAL | Status: DC | PRN

## 2023-08-21 MED ORDER — ONDANSETRON HCL 4 MG/2ML IJ SOLN
4.0000 mg | Freq: Four times a day (QID) | INTRAMUSCULAR | Status: DC | PRN
  Administered 2023-08-21: 4 mg via INTRAVENOUS
  Filled 2023-08-21: qty 2

## 2023-08-21 MED ORDER — METRONIDAZOLE 500 MG/100ML IV SOLN
500.0000 mg | Freq: Once | INTRAVENOUS | Status: AC
  Administered 2023-08-21: 500 mg via INTRAVENOUS
  Filled 2023-08-21: qty 100

## 2023-08-21 MED ORDER — SODIUM CHLORIDE 0.9 % IV BOLUS
1000.0000 mL | Freq: Once | INTRAVENOUS | Status: AC
  Administered 2023-08-21: 1000 mL via INTRAVENOUS

## 2023-08-21 MED ORDER — POTASSIUM CHLORIDE 10 MEQ/100ML IV SOLN
10.0000 meq | INTRAVENOUS | Status: AC
  Administered 2023-08-21 – 2023-08-22 (×3): 10 meq via INTRAVENOUS
  Filled 2023-08-21 (×3): qty 100

## 2023-08-21 MED ORDER — IOHEXOL 300 MG/ML  SOLN
100.0000 mL | Freq: Once | INTRAMUSCULAR | Status: AC | PRN
  Administered 2023-08-21: 100 mL via INTRAVENOUS

## 2023-08-21 MED ORDER — DIPHENHYDRAMINE HCL 50 MG/ML IJ SOLN
25.0000 mg | Freq: Once | INTRAMUSCULAR | Status: AC
  Administered 2023-08-21: 25 mg via INTRAVENOUS
  Filled 2023-08-21: qty 1

## 2023-08-21 MED ORDER — HYDROMORPHONE HCL 1 MG/ML IJ SOLN
0.5000 mg | Freq: Four times a day (QID) | INTRAMUSCULAR | Status: DC | PRN
  Administered 2023-08-21 – 2023-08-24 (×11): 0.5 mg via INTRAVENOUS
  Filled 2023-08-21 (×11): qty 0.5

## 2023-08-21 MED ORDER — LACTATED RINGERS IV BOLUS (SEPSIS)
250.0000 mL | Freq: Once | INTRAVENOUS | Status: AC
  Administered 2023-08-21: 250 mL via INTRAVENOUS

## 2023-08-21 MED ORDER — ENOXAPARIN SODIUM 40 MG/0.4ML IJ SOSY
40.0000 mg | PREFILLED_SYRINGE | INTRAMUSCULAR | Status: DC
  Administered 2023-08-21 – 2023-08-25 (×5): 40 mg via SUBCUTANEOUS
  Filled 2023-08-21 (×5): qty 0.4

## 2023-08-21 NOTE — Sepsis Progress Note (Signed)
Code Sepsis protocol being monitored by eLink.

## 2023-08-21 NOTE — ED Notes (Signed)
Patient transported to X-ray

## 2023-08-21 NOTE — H&P (Signed)
History and Physical  Cheryl Daniels WGN:[id number] DOB: 1981-06-21 DOA: 08/21/2023  PCP: Murlean Caller, MD   Chief Complaint: Nausea/vomiting, diarrhea, back pain  HPI: Cheryl Daniels is a 43 y.o. female with medical history significant for asthma, HTN, cryptosporidial gastroenteritis, iron deficiency anemia, sepsis, folate deficiency and allergies who presented to the ED for evaluation of N/V/D, and worsening right-sided back pain.  Patient reports that over the last 4 to 5 days, she has been unable to keep any food down.  She reported having a fever of 102.5 on Sunday but they have been low-grade since then.  She also endorsed pain in her right lower and middle back as well as chills, 2-3 watery stools a day, headache, intermittent dizziness and some palpitations.  She denies any chest pain, vision changes, shortness of breath, abdominal pain, dysuria, hematuria or bloody stools.  No recent exposure to the pool or lakes.  She does report that there has been problem with the water in [place] but she mostly uses to brush her teeth and only drinks bottled water.  Reports her menses started today however her back pain is different from her menstrual pain.  ED Course: Initial vitals show significant tachycardia with HR in the 110s to 120s, mild hypertension with SBP in the 140s otherwise stable vitals.  Labs significant for K+ of 2.9, AGMA, normal creatinine, normal lipase, Hgb 11.4, WBC 6.8, platelet 509, negative pregnancy test, negative flu, COVID and RSV test, UA with mild glucosuria, large hemoglobinuria, mild ketonuria, moderate proteinuria but no signs of infection, lactic acid 3.5->2.1->3.6.  EKG shows sinus tach with RAE. CXR with no acute findings.  CT A/P with no acute intra-abdominal process. Sepsis protocol was activated and patient was started on IV fluids, IV Rocephin, and IV Flagyl., Patient also received oral Kcl 40 mEq x1, and IV Zofran 4 mg x 1. TRH was consulted for admission  Review  of Systems: Please see HPI for pertinent positives and negatives. A complete 10 system review of systems are otherwise negative.  Past Medical History:  Diagnosis Date   Asthma    Back injuries    Hypertension    Past Surgical History:  Procedure Laterality Date   COLONOSCOPY WITH PROPOFOL Left 06/18/2021   Procedure: COLONOSCOPY WITH PROPOFOL;  Surgeon: Willis Modena, MD;  Location: [facility];  Service: Endoscopy;  Laterality: Left;   ESOPHAGOGASTRODUODENOSCOPY (EGD) WITH PROPOFOL N/A 06/18/2021   Procedure: ESOPHAGOGASTRODUODENOSCOPY (EGD) WITH PROPOFOL;  Surgeon: Willis Modena, MD;  Location: [facility];  Service: Endoscopy;  Laterality: N/A;   NO PAST SURGERIES     Social History:  reports that she quit smoking about 9 years ago. She started smoking about 19 years ago. She has a 5 pack-year smoking history. She has never used smokeless tobacco. She reports that she does not currently use drugs after having used the following drugs: Marijuana. She reports that she does not drink alcohol.  No Known Allergies  Family History  Problem Relation Age of Onset   Hypertension Father      Prior to Admission medications   Medication Sig Start Date End Date Taking? Authorizing Provider  amLODipine (NORVASC) 10 MG tablet Take 1 tablet (10 mg total) by mouth daily. 06/20/23 09/18/23  Regalado, Jon Billings A, MD  dicyclomine (BENTYL) 10 MG capsule Take 1 capsule (10 mg total) by mouth 3 (three) times daily between meals as needed for spasms. 06/20/23   Regalado, Belkys A, MD  diphenhydrAMINE (BENADRYL) 25 MG tablet Take 25 mg  by mouth at bedtime as needed for allergies.    [provider]  ferrous sulfate 325 (65 FE) MG tablet Take 1 tablet (325 mg total) by mouth daily with breakfast. 06/20/23   Regalado, Belkys A, MD  folic acid (FOLVITE) 1 MG tablet Take 1 tablet (1 mg total) by mouth daily. 06/20/23   Regalado, Prentiss Bells, MD    Physical Exam: BP 136/81   Pulse (!) 101   Temp  98.3 F (36.8 C) (Oral)   Resp 19   Ht 5\' 3"  (1.6 m)   Wt 68 kg   LMP 08/21/2023   SpO2 99%   BMI 26.57 kg/m  General: Acutely ill middle-age woman sitting in bed with emesis bag. No acute distress. HEENT: /AT. Anicteric sclera. Dry mucous membrane. Pale conjunctiva. CV: Tachycardic. Regular rhythm. No murmurs, rubs, or gallops. No LE edema Pulmonary: Lungs CTAB. Normal effort. No wheezing or rales. Abdominal: Soft, nontender, nondistended. Normal bowel sounds. MSK: Moderate ttp of the right paraspinal muscles. Normal ROM. Skin: Warm and dry. No obvious rash or lesions. Neuro: A&Ox3. Moves all extremities. Normal sensation to light touch. No focal deficit. Psych: Normal mood and affect          Labs on Admission:  Basic Metabolic Panel: Recent Labs  Lab 08/21/23 1143  NA 139  K 2.9*  CL 101  CO2 21*  GLUCOSE 112*  BUN 11  CREATININE 0.64  CALCIUM 8.8*   Liver Function Tests: Recent Labs  Lab 08/21/23 1143  AST 31  ALT 18  ALKPHOS 99  BILITOT 0.6  PROT 7.6  ALBUMIN 4.3   Recent Labs  Lab 08/21/23 1143  LIPASE 26   No results for input(s): "AMMONIA" in the last 168 hours. CBC: Recent Labs  Lab 08/21/23 1143  WBC 6.8  NEUTROABS 5.4  HGB 11.4*  HCT 37.2  MCV 80.7  PLT 509*   Cardiac Enzymes: No results for input(s): "CKTOTAL", "CKMB", "CKMBINDEX", "TROPONINI" in the last 168 hours. BNP (last 3 results) No results for input(s): "BNP" in the last 8760 hours.  ProBNP (last 3 results) No results for input(s): "PROBNP" in the last 8760 hours.  CBG: No results for input(s): "GLUCAP" in the last 168 hours.  Radiological Exams on Admission: CT ABDOMEN PELVIS W CONTRAST Result Date: 08/21/2023 CLINICAL DATA:  Right lower quadrant abdominal pain. EXAM: CT ABDOMEN AND PELVIS WITH CONTRAST TECHNIQUE: Multidetector CT imaging of the abdomen and pelvis was performed using the standard protocol following bolus administration of intravenous contrast.  RADIATION DOSE REDUCTION: This exam was performed according to the departmental dose-optimization program which includes automated exposure control, adjustment of the mA and/or kV according to patient size and/or use of iterative reconstruction technique. CONTRAST:  OMNIPAQUE IOHEXOL 300 MG/ML  SOLN COMPARISON:  CT scan abdomen and pelvis from 06/16/2023. FINDINGS: Lower chest: The lung bases are clear. No pleural effusion. The heart is normal in size. No pericardial effusion. Hepatobiliary: The liver is normal in size. Non-cirrhotic configuration. No suspicious mass. These is mild diffuse hepatic steatosis. No intrahepatic or extrahepatic bile duct dilation. No calcified gallstones. Normal gallbladder wall thickness. No pericholecystic inflammatory changes. Pancreas: Unremarkable. No pancreatic ductal dilatation or surrounding inflammatory changes. Spleen: Within normal limits. No focal lesion. Adrenals/Urinary Tract: Adrenal glands are unremarkable. No suspicious renal mass. No hydronephrosis. No renal or ureteric calculi. Urinary bladder is under distended, precluding optimal assessment. However, no large mass or stones identified. No perivesical fat stranding. Stomach/Bowel: There is a small sliding  hiatal hernia. No disproportionate dilation of the small or large bowel loops. No evidence of abnormal bowel wall thickening or inflammatory changes. The appendix is unremarkable. There is submucosal fat deposition also known as "Fat halo sign" noted in the cecum and ascending colon. In the absence of clinical evidence of inflammatory bowel disease, the presence of the "fat halo sign" represent a normal finding that is possibly related to obesity. Vascular/Lymphatic: No ascites or pneumoperitoneum. No abdominal or pelvic lymphadenopathy, by size criteria. No aneurysmal dilation of the major abdominal arteries. Reproductive: Bulky anteverted uterus containing several ill-defined lesions, favored to represent  leiomyomas. Bilateral ovaries are within normal limits. No adnexal mass seen. Other: There is a tiny fat containing umbilical hernia. The soft tissues and abdominal wall are otherwise unremarkable. Musculoskeletal: No suspicious osseous lesions. IMPRESSION: *No acute inflammatory process identified within the abdomen or pelvis. Unremarkable appendix. *Multiple other nonacute observations, as described above. Electronically Signed   By: Jules Schick M.D.   On: 08/21/2023 15:25   DG Chest Portable 1 View Result Date: 08/21/2023 CLINICAL DATA:  Fever. EXAM: PORTABLE CHEST 1 VIEW COMPARISON:  Chest radiograph dated June 16, 2023. FINDINGS: The heart size and mediastinal contours are within normal limits. No focal consolidation, sizeable pleural effusion, or pneumothorax. No acute osseous abnormality. IMPRESSION: No acute cardiopulmonary findings. Electronically Signed   By: Hart Robinsons M.D.   On: 08/21/2023 13:45   Assessment/Plan Cheryl Daniels is a 43 y.o. female with medical history significant for asthma, HTN, cryptosporidial gastroenteritis, iron deficiency anemia, sepsis, folate deficiency and allergies who presented to the ED for evaluation of N/V/D, and worsening right-sided back pain and admitted for gastroenteritis  # Gastroenteritis # SIRS # Lactic acidosis Patient with history of Cryptosporidium gastroenteritis now presenting with 4-5 days of nausea, vomiting and diarrhea.  Patient severely dehydrated on exam with continuous nausea and vomiting. Sepsis protocol activated in the ED.  Patient with tachycardia and an elevated lactic acid likely due to severe dehydration but no leukocytosis, fever and no evidence of urinary, respiratory or intra-abdominal infection. -Hold off on further antibiotics for now -Continue IV LR 150 cc/h -Follow-up GI panel and repeat lactic acid -Trend and replete electrolytes -Enteric precautions  # Hypokalemia #AGMA K+ down to 2.9 on admission with mild  drop in bicarb to 21. This is secondary to severe dehydration in the setting of nausea and vomiting and poor p.o. intake. -Continue IV hydration as above -IV KCl 10 mEq x 3 hours -Follow-up mag and Phos  # Iron deficiency anemia # Folate deficiency Has received iron infusions in the past.  Baseline Hgb of 8-9.  Hgb 11.4 likely due to hemoconcentration. -Check iron studies, ferritin, folate and vitamin B12 -Trend CBC  # Back muscle strain Patient reports ongoing right lower back pain and spasms likely secondary to constant retching.  There is moderate tenderness to palpation of the paraspinal muscles. -As needed IV Dilaudid for pain  # HTN BP elevated with SBP in the 130s to 140s in the setting of pain and constant nausea vomiting -CTM  # Leiomyomas CT A/P shows several ill-defined lesions favored to represent leiomyomas but normal ovaries and no adnexal mass. Reports her menstrual cycle started today, which is likely contributing to her back pain/spasms. -Follow-up with OB/GYN in the outpatient   DVT prophylaxis: Lovenox     Code Status: Full Code  Consults called: None  Family Communication: No family at bedside  Severity of Illness: The appropriate patient status for this  patient is OBSERVATION. Observation status is judged to be reasonable and necessary in order to provide the required intensity of service to ensure the patient's safety. The patient's presenting symptoms, physical exam findings, and initial radiographic and laboratory data in the context of their medical condition is felt to place them at decreased risk for further clinical deterioration. Furthermore, it is anticipated that the patient will be medically stable for discharge from the hospital within 2 midnights of admission.   Level of care: Telemetry   Steffanie Rainwater, MD 08/21/2023, 7:28 PM [facility] Pager: [phone] Isaiah 41:10   If 7PM-7AM, please contact  night-coverage [email or web address] Password [email or web address]

## 2023-08-21 NOTE — ED Triage Notes (Signed)
C/o lower back pain radiating into right side of abd with n/v/d, chills, and headache x4 days.  Pain increases with palpitation.  Denies urinary s/sy. Denies ETOH usage.  Started menstrual this am.

## 2023-08-21 NOTE — ED Provider Notes (Signed)
[place] EMERGENCY DEPARTMENT AT [facility] Provider Note   CSN: [id number] Arrival date & time: 08/21/23  1020     History    Cheryl Daniels is a 43 y.o. female.  HPI   Patient has a history of hypertensive urgency, symptomatic anemia, acid reflux, sepsis, cryptosporidia gastroenteritis.  Patient states she was admitted to the hospital on November of last year.  Patient was diagnosed with sepsis and cryptosporidia gastroenteritis.  Patient states she has not followed up with anyone since that treatment.  She had been doing well until the last several days when she started having trouble with nausea vomiting diarrhea and chills.  Patient states she has had multiple episodes of diarrhea.  No blood noted.  She is also had episodes of nausea and vomiting.  She is not able to keep anything down.  She is having pain that initially started in her lower back that is moving towards the front of her abdomen.  She also has felt lightheaded with tingling throughout her body.  She has felt her heart racing.  She denies any dysuria or hematuria  Home Medications Prior to Admission medications   Medication Sig Start Date End Date Taking? Authorizing Provider  amLODipine (NORVASC) 10 MG tablet Take 1 tablet (10 mg total) by mouth daily. 06/20/23 09/18/23  Regalado, Shey Bartmess Billings A, MD  dicyclomine (BENTYL) 10 MG capsule Take 1 capsule (10 mg total) by mouth 3 (three) times daily between meals as needed for spasms. 06/20/23   Regalado, Belkys A, MD  diphenhydrAMINE (BENADRYL) 25 MG tablet Take 25 mg by mouth at bedtime as needed for allergies.    [provider]  ferrous sulfate 325 (65 FE) MG tablet Take 1 tablet (325 mg total) by mouth daily with breakfast. 06/20/23   Regalado, Belkys A, MD  folic acid (FOLVITE) 1 MG tablet Take 1 tablet (1 mg total) by mouth daily. 06/20/23   Regalado, Prentiss Bells, MD      Allergies    Patient has no known allergies.    Review of Systems   Review of  Systems  Physical Exam Updated Vital Signs BP (!) 140/91   Pulse 90   Temp 97.9 F (36.6 C) (Oral)   Resp 14   Ht 1.6 m (5\' 3" )   Wt 68 kg   LMP 08/21/2023   SpO2 99%   BMI 26.57 kg/m  Physical Exam Vitals and nursing note reviewed.  Constitutional:      General: She is not in acute distress.    Appearance: She is well-developed.  HENT:     Head: Normocephalic and atraumatic.     Right Ear: External ear normal.     Left Ear: External ear normal.  Eyes:     General: No scleral icterus.       Right eye: No discharge.        Left eye: No discharge.     Conjunctiva/sclera: Conjunctivae normal.  Neck:     Trachea: No tracheal deviation.  Cardiovascular:     Rate and Rhythm: Regular rhythm. Tachycardia present.  Pulmonary:     Effort: Pulmonary effort is normal. No respiratory distress.     Breath sounds: Normal breath sounds. No stridor. No wheezing or rales.  Abdominal:     General: Bowel sounds are normal. There is no distension.     Palpations: Abdomen is soft.     Tenderness: There is abdominal tenderness. There is no guarding or rebound.  Comments: Ttp lower abdomen  Musculoskeletal:        General: No tenderness or deformity.     Cervical back: Neck supple.  Skin:    General: Skin is warm and dry.     Findings: No rash.  Neurological:     General: No focal deficit present.     Mental Status: She is alert.     Cranial Nerves: No cranial nerve deficit, dysarthria or facial asymmetry.     Sensory: No sensory deficit.     Motor: No abnormal muscle tone or seizure activity.     Coordination: Coordination normal.  Psychiatric:        Mood and Affect: Mood normal.     ED Results / Procedures / Treatments   Labs (all labs ordered are listed, but only abnormal results are displayed) Labs Reviewed  COMPREHENSIVE METABOLIC PANEL - Abnormal; Notable for the following components:      Result Value   Potassium 2.9 (*)    CO2 21 (*)    Glucose, Bld 112 (*)     Calcium 8.8 (*)    Anion gap 17 (*)    All other components within normal limits  CBC WITH DIFFERENTIAL/PLATELET - Abnormal; Notable for the following components:   Hemoglobin 11.4 (*)    MCH 24.7 (*)    RDW 19.7 (*)    Platelets 509 (*)    All other components within normal limits  URINALYSIS, ROUTINE W REFLEX MICROSCOPIC - Abnormal; Notable for the following components:   Glucose, UA 50 (*)    Hgb urine dipstick LARGE (*)    Ketones, ur 5 (*)    Protein, ur 100 (*)    All other components within normal limits  I-STAT CG4 LACTIC ACID, ED - Abnormal; Notable for the following components:   Lactic Acid, Venous 3.5 (*)    All other components within normal limits  I-STAT CG4 LACTIC ACID, ED - Abnormal; Notable for the following components:   Lactic Acid, Venous 2.1 (*)    All other components within normal limits  I-STAT CG4 LACTIC ACID, ED - Abnormal; Notable for the following components:   Lactic Acid, Venous 3.6 (*)    All other components within normal limits  RESP PANEL BY RT-PCR (RSV, FLU A&B, COVID)  RVPGX2  CULTURE, BLOOD (ROUTINE X 2)  CULTURE, BLOOD (ROUTINE X 2)  GASTROINTESTINAL PANEL BY PCR, STOOL (REPLACES STOOL CULTURE)  LIPASE, BLOOD  HCG, SERUM, QUALITATIVE  PROTIME-INR  URINALYSIS, W/ REFLEX TO CULTURE (INFECTION SUSPECTED)  I-STAT CG4 LACTIC ACID, ED    EKG EKG Interpretation Date/Time:  Wednesday August 21 2023 13:29:56 EST Ventricular Rate:  105 PR Interval:  180 QRS Duration:  93 QT Interval:  338 QTC Calculation: 447 R Axis:   85  Text Interpretation: Sinus tachycardia Right atrial enlargement Borderline T abnormalities, anterior leads Since last tracing rate faster Confirmed by Linwood Dibbles [id number]) on 08/21/2023 2:48:04 PM  Radiology CT ABDOMEN PELVIS W CONTRAST Result Date: 08/21/2023 CLINICAL DATA:  Right lower quadrant abdominal pain. EXAM: CT ABDOMEN AND PELVIS WITH CONTRAST TECHNIQUE: Multidetector CT imaging of the abdomen and pelvis was  performed using the standard protocol following bolus administration of intravenous contrast. RADIATION DOSE REDUCTION: This exam was performed according to the departmental dose-optimization program which includes automated exposure control, adjustment of the mA and/or kV according to patient size and/or use of iterative reconstruction technique. CONTRAST:  OMNIPAQUE IOHEXOL 300 MG/ML  SOLN COMPARISON:  CT scan abdomen and  pelvis from 06/16/2023. FINDINGS: Lower chest: The lung bases are clear. No pleural effusion. The heart is normal in size. No pericardial effusion. Hepatobiliary: The liver is normal in size. Non-cirrhotic configuration. No suspicious mass. These is mild diffuse hepatic steatosis. No intrahepatic or extrahepatic bile duct dilation. No calcified gallstones. Normal gallbladder wall thickness. No pericholecystic inflammatory changes. Pancreas: Unremarkable. No pancreatic ductal dilatation or surrounding inflammatory changes. Spleen: Within normal limits. No focal lesion. Adrenals/Urinary Tract: Adrenal glands are unremarkable. No suspicious renal mass. No hydronephrosis. No renal or ureteric calculi. Urinary bladder is under distended, precluding optimal assessment. However, no large mass or stones identified. No perivesical fat stranding. Stomach/Bowel: There is a small sliding hiatal hernia. No disproportionate dilation of the small or large bowel loops. No evidence of abnormal bowel wall thickening or inflammatory changes. The appendix is unremarkable. There is submucosal fat deposition also known as "Fat halo sign" noted in the cecum and ascending colon. In the absence of clinical evidence of inflammatory bowel disease, the presence of the "fat halo sign" represent a normal finding that is possibly related to obesity. Vascular/Lymphatic: No ascites or pneumoperitoneum. No abdominal or pelvic lymphadenopathy, by size criteria. No aneurysmal dilation of the major abdominal arteries.  Reproductive: Bulky anteverted uterus containing several ill-defined lesions, favored to represent leiomyomas. Bilateral ovaries are within normal limits. No adnexal mass seen. Other: There is a tiny fat containing umbilical hernia. The soft tissues and abdominal wall are otherwise unremarkable. Musculoskeletal: No suspicious osseous lesions. IMPRESSION: *No acute inflammatory process identified within the abdomen or pelvis. Unremarkable appendix. *Multiple other nonacute observations, as described above. Electronically Signed   By: Jules Schick M.D.   On: 08/21/2023 15:25   DG Chest Portable 1 View Result Date: 08/21/2023 CLINICAL DATA:  Fever. EXAM: PORTABLE CHEST 1 VIEW COMPARISON:  Chest radiograph dated June 16, 2023. FINDINGS: The heart size and mediastinal contours are within normal limits. No focal consolidation, sizeable pleural effusion, or pneumothorax. No acute osseous abnormality. IMPRESSION: No acute cardiopulmonary findings. Electronically Signed   By: Hart Robinsons M.D.   On: 08/21/2023 13:45    Procedures Procedures    Medications Ordered in ED Medications  lactated ringers infusion (has no administration in time range)  lactated ringers bolus 1,000 mL (1,000 mLs Intravenous New Bag/Given 08/21/23 1315)    And  lactated ringers bolus 1,000 mL (1,000 mLs Intravenous New Bag/Given 08/21/23 1322)    And  lactated ringers bolus 250 mL (has no administration in time range)  ondansetron (ZOFRAN-ODT) disintegrating tablet 4 mg (4 mg Oral Given 08/21/23 1303)  sodium chloride 0.9 % bolus 1,000 mL (1,000 mLs Intravenous New Bag/Given 08/21/23 1456)  cefTRIAXone (ROCEPHIN) 2 g in sodium chloride 0.9 % 100 mL IVPB (0 g Intravenous Stopped 08/21/23 1454)  metroNIDAZOLE (FLAGYL) IVPB 500 mg (0 mg Intravenous Stopped 08/21/23 1551)  potassium chloride SA (KLOR-CON M) CR tablet 40 mEq (40 mEq Oral Given 08/21/23 1452)  iohexol (OMNIPAQUE) 300 MG/ML solution 100 mL (100 mLs Intravenous  Contrast Given 08/21/23 1353)  acetaminophen (TYLENOL) tablet 650 mg (650 mg Oral Given 08/21/23 1452)    ED Course/ Medical Decision Making/ A&P Clinical Course as of 08/21/23 1615  Wed Aug 21, 2023  1234 Additional labs notable for elevated lactic acid level of 3.5. [JK]  1327 CBC shows improved anemia.  No leukocytosis.  Metabolic panel does show hypokalemia [JK]  1402 Urinalysis does not suggest UTI. [JK]  1553 CT scan does not show any acute abnormality. [JK]  1557 .  Lactic acid level increasing.  Patient still tachycardic at the bedside although normotensive [JK]  1614 Case discussed with medical service regarding admission [JK]    Clinical Course User Index [JK] Linwood Dibbles, MD                                 Medical Decision Making Problems Addressed: Gastroenteritis: acute illness or injury that poses a threat to life or bodily functions Lactic acid acidosis: acute illness or injury that poses a threat to life or bodily functions  Amount and/or Complexity of Data Reviewed Labs: ordered. Decision-making details documented in ED Course. Radiology: ordered and independent interpretation performed.  Risk OTC drugs. Prescription drug management. Decision regarding hospitalization.   Patient presented to the ED for evaluation of vomiting diarrhea and weakness.  Patient notably tachycardic on arrival.  Laboratory test did not show any signs of severe dehydration.  No anemia or leukocytosis.  Urinalysis did not show definite UTI.  COVID flu RSV negative.  Patient was having lower abdominal cramping as well as vomiting and diarrhea.  CT scan was performed.  No evidence of diverticulitis colitis appendicitis.  Patient's lactic acid levels however remained elevated and her third level is actually increasing.  Blood cultures have been ordered.  Cannot rule out bacteremia.  She has been started on empiric antibiotics.  I will consult the medical service for admission and further  evaluation        Final Clinical Impression(s) / ED Diagnoses Final diagnoses:  Gastroenteritis  Lactic acid acidosis    Rx / DC Orders ED Discharge Orders     None         Linwood Dibbles, MD 08/21/23 1615

## 2023-08-21 NOTE — ED Notes (Signed)
MD in room

## 2023-08-21 NOTE — ED Notes (Signed)
I stat Lactic Acid 3.5 given to MD Lynelle Doctor and RN Morrie Sheldon in triage.

## 2023-08-21 NOTE — ED Notes (Signed)
I stat Lactic Acid given to MD Trifan and RN L-il

## 2023-08-21 NOTE — ED Provider Triage Note (Signed)
Emergency Medicine Provider Triage Evaluation Note  Cheryl Daniels , a 43 y.o. female  was evaluated in triage.  Pt complains of nausea, vomiting, feeling unwell and shaky, right lower quadrant and pelvic pain.  Reports started her period today. Not been around anyone that is sick. Reports fever and chills at home.   Review of Systems  Positive: Abdominal pain, NV Negative: Chest pain, shortness of breath  Physical Exam  BP (!) 141/106 (BP Location: Left Arm)   Pulse (!) 128   Temp 97.9 F (36.6 C) (Oral)   Resp 14   Ht 5\' 3"  (1.6 m)   Wt 68 kg   LMP 08/21/2023   SpO2 99%   BMI 26.57 kg/m  Gen:   Awake, no distress   Resp:  Normal effort  MSK:   Moves extremities without difficulty  Other:  RLQ, right pelvic pain, suprapubic pain  Medical Decision Making  Medically screening exam initiated at 11:29 AM.  Appropriate orders placed.  [name] was informed that the remainder of the evaluation will be completed by another provider, this initial triage assessment does not replace that evaluation, and the importance of remaining in the ED until their evaluation is complete.  Patient has past medical history of cystitis causing sepsis.  She reports she feels the same way.  Patient has history of alcohol use and tobacco use reported in chart however patient reports she has not yet drink and EtOH nor smoked marijuana for several weeks.   Smitty Knudsen, PA-C 08/21/23 1132

## 2023-08-21 NOTE — ED Notes (Signed)
Pt just got back from scans

## 2023-08-21 NOTE — Plan of Care (Signed)
Problem: Clinical Measurements: Goal: Ability to maintain clinical measurements within normal limits will improve Outcome: Progressing   Problem: Coping: Goal: Level of anxiety will decrease Outcome: Progressing

## 2023-08-22 DIAGNOSIS — D509 Iron deficiency anemia, unspecified: Secondary | ICD-10-CM | POA: Diagnosis present

## 2023-08-22 DIAGNOSIS — E872 Acidosis, unspecified: Secondary | ICD-10-CM

## 2023-08-22 DIAGNOSIS — R102 Pelvic and perineal pain: Secondary | ICD-10-CM | POA: Diagnosis present

## 2023-08-22 DIAGNOSIS — Z87891 Personal history of nicotine dependence: Secondary | ICD-10-CM | POA: Diagnosis not present

## 2023-08-22 DIAGNOSIS — E876 Hypokalemia: Secondary | ICD-10-CM | POA: Diagnosis present

## 2023-08-22 DIAGNOSIS — R824 Acetonuria: Secondary | ICD-10-CM | POA: Diagnosis present

## 2023-08-22 DIAGNOSIS — Z79899 Other long term (current) drug therapy: Secondary | ICD-10-CM | POA: Diagnosis not present

## 2023-08-22 DIAGNOSIS — E538 Deficiency of other specified B group vitamins: Secondary | ICD-10-CM | POA: Diagnosis present

## 2023-08-22 DIAGNOSIS — I10 Essential (primary) hypertension: Secondary | ICD-10-CM | POA: Diagnosis present

## 2023-08-22 DIAGNOSIS — E860 Dehydration: Secondary | ICD-10-CM | POA: Diagnosis present

## 2023-08-22 DIAGNOSIS — R81 Glycosuria: Secondary | ICD-10-CM | POA: Diagnosis present

## 2023-08-22 DIAGNOSIS — A084 Viral intestinal infection, unspecified: Secondary | ICD-10-CM | POA: Diagnosis present

## 2023-08-22 DIAGNOSIS — R809 Proteinuria, unspecified: Secondary | ICD-10-CM | POA: Diagnosis present

## 2023-08-22 DIAGNOSIS — K529 Noninfective gastroenteritis and colitis, unspecified: Secondary | ICD-10-CM | POA: Diagnosis present

## 2023-08-22 DIAGNOSIS — Z8249 Family history of ischemic heart disease and other diseases of the circulatory system: Secondary | ICD-10-CM | POA: Diagnosis not present

## 2023-08-22 DIAGNOSIS — R823 Hemoglobinuria: Secondary | ICD-10-CM | POA: Diagnosis present

## 2023-08-22 DIAGNOSIS — Z1152 Encounter for screening for COVID-19: Secondary | ICD-10-CM | POA: Diagnosis not present

## 2023-08-22 LAB — BASIC METABOLIC PANEL
Anion gap: 8 (ref 5–15)
BUN: 10 mg/dL (ref 6–20)
CO2: 24 mmol/L (ref 22–32)
Calcium: 7.8 mg/dL — ABNORMAL LOW (ref 8.9–10.3)
Chloride: 102 mmol/L (ref 98–111)
Creatinine, Ser: 0.66 mg/dL (ref 0.44–1.00)
GFR, Estimated: >60 mL/min (ref >60)
Glucose, Bld: 88 mg/dL (ref 70–99)
Potassium: 3.1 mmol/L — ABNORMAL LOW (ref 3.5–5.1)
Sodium: 134 mmol/L — ABNORMAL LOW (ref 135–145)

## 2023-08-22 LAB — CBC
HCT: 29.2 % — ABNORMAL LOW (ref 36.0–46.0)
Hemoglobin: 8.8 g/dL — ABNORMAL LOW (ref 12.0–15.0)
MCH: 25.1 pg — ABNORMAL LOW (ref 26.0–34.0)
MCHC: 30.1 g/dL (ref 30.0–36.0)
MCV: 83.4 fL (ref 80.0–100.0)
Platelets: 350 10*3/uL (ref 150–400)
RBC: 3.5 MIL/uL — ABNORMAL LOW (ref 3.87–5.11)
RDW: 19.6 % — ABNORMAL HIGH (ref 11.5–15.5)
WBC: 4.4 10*3/uL (ref 4.0–10.5)
nRBC: 0 % (ref 0.0–0.2)

## 2023-08-22 LAB — GASTROINTESTINAL PANEL BY PCR, STOOL (REPLACES STOOL CULTURE)

## 2023-08-22 LAB — IRON AND TIBC
Iron: 313 ug/dL — ABNORMAL HIGH (ref 28–170)
Saturation Ratios: 83 % — ABNORMAL HIGH (ref 10.4–31.8)
TIBC: 375 ug/dL (ref 250–450)
UIBC: 62 ug/dL

## 2023-08-22 LAB — LACTIC ACID, PLASMA: Lactic Acid, Venous: 1.5 mmol/L (ref 0.5–1.9)

## 2023-08-22 LAB — VITAMIN B12: Vitamin B-12: 198 pg/mL (ref 180–914)

## 2023-08-22 LAB — PHOSPHORUS: Phosphorus: 1.4 mg/dL — ABNORMAL LOW (ref 2.5–4.6)

## 2023-08-22 LAB — FOLATE: Folate: 3.1 ng/mL — ABNORMAL LOW (ref >5.9)

## 2023-08-22 LAB — MAGNESIUM: Magnesium: 1.6 mg/dL — ABNORMAL LOW (ref 1.7–2.4)

## 2023-08-22 LAB — FERRITIN: Ferritin: 11 ng/mL (ref 11–307)

## 2023-08-22 MED ORDER — DIPHENHYDRAMINE HCL 50 MG/ML IJ SOLN
25.0000 mg | Freq: Four times a day (QID) | INTRAMUSCULAR | Status: DC | PRN
  Administered 2023-08-23 – 2023-08-25 (×10): 25 mg via INTRAVENOUS
  Filled 2023-08-22 (×11): qty 1

## 2023-08-22 MED ORDER — KETOROLAC TROMETHAMINE 30 MG/ML IJ SOLN
30.0000 mg | Freq: Once | INTRAMUSCULAR | Status: AC
  Administered 2023-08-22: 30 mg via INTRAVENOUS
  Filled 2023-08-22: qty 1

## 2023-08-22 MED ORDER — DIPHENHYDRAMINE HCL 25 MG PO CAPS
25.0000 mg | ORAL_CAPSULE | Freq: Once | ORAL | Status: AC
  Administered 2023-08-22: 25 mg via ORAL
  Filled 2023-08-22: qty 1

## 2023-08-22 MED ORDER — MAGNESIUM SULFATE 4 GM/100ML IV SOLN
4.0000 g | Freq: Once | INTRAVENOUS | Status: AC
  Administered 2023-08-22: 4 g via INTRAVENOUS
  Filled 2023-08-22: qty 100

## 2023-08-22 MED ORDER — POTASSIUM PHOSPHATES 15 MMOLE/5ML IV SOLN
30.0000 mmol | Freq: Once | INTRAVENOUS | Status: AC
  Administered 2023-08-22: 30 mmol via INTRAVENOUS
  Filled 2023-08-22: qty 10

## 2023-08-22 MED ORDER — POTASSIUM CHLORIDE 10 MEQ/100ML IV SOLN
10.0000 meq | INTRAVENOUS | Status: AC
  Administered 2023-08-22 (×4): 10 meq via INTRAVENOUS
  Filled 2023-08-22 (×4): qty 100

## 2023-08-22 NOTE — Progress Notes (Signed)
PROGRESS NOTE  Cheryl Daniels MWU:[id number] DOB: 02-01-1981 DOA: 08/21/2023 PCP: Murlean Caller, MD  Brief History   The patient is a 43 yr old woman who presented to [facility] with complaints of NVD and worsening right sided back pain. She has been vomiting and unable to keep down any food for 4-5 days. She had a fever of 102.5 on "Sunday. She has also had 2-4 watery stool, day.   In the ED the patient was found to be tachycardic, hypokalemic at 2.9, AGMA, negative COVID, Flu, and RSV. Lactic acid was elevated at 3.5 to 2.1 and 3.6. The patient was started on the sepsis protocol and received IV fluids, IV Rocephin and IV Flagyl. She received Zofran and potassium replacement.   This morning the patient states that she is feeling better. She has had 2 BM's by noon today. She states that she would like to try clear liquids.   A & P  Assessment/Plan Chelcy L Mangan is a 42 y.o. female with medical history significant for asthma, HTN, cryptosporidial gastroenteritis, iron deficiency anemia, sepsis, folate deficiency and allergies who presented to the ED for evaluation of N/V/D, and worsening right-sided back pain and admitted for gastroenteritis   Gastroenteritis SIRS Lactic acidosis SIRS and Lactic Acidosis is resolved. Patient with history of Cryptosporidium gastroenteritis now presenting with 4-5 days of nausea, vomiting and diarrhea.  Patient with tachycardia and an elevated lactic acid likely due to severe dehydration but no leukocytosis, fever and no evidence of urinary, respiratory or intra-abdominal infection. - Enteric panel is negative. -Hold off on further antibiotics for now -stop IV antibiotics -Enteric precautions   Hypokalemia Hypomagnesemia #AGMA Supplement Magnesium, Potassium, and phos.   Iron deficiency anemia Folate deficiency Has received iron infusions in the past.  Baseline Hgb of 8-9.  Hgb 11.4 likely due to hemoconcentration. -Check iron studies, ferritin, folate and  vitamin B12 -Trend CBC   Back muscle strain Patient reports ongoing right lower back pain and spasms likely secondary to constant retching.  There is moderate tenderness to palpation of the paraspinal muscles. -As needed IV Dilaudid for pain   HTN BP elevated with SBP in the 130s to 140s in the setting of pain and constant nausea vomiting -CTM   Leiomyomas CT A/P shows several ill-defined lesions favored to represent leiomyomas but normal ovaries and no adnexal mass. Reports her menstrual cycle started today, which is likely contributing to her back pain/spasms. -Follow-up with OB/GYN in the outpatient     DVT prophylaxis: Lovenox      Code Status: Full Code   Consults called: None   Family Communication: No family at bedside    I have seen and examined this patient myself. I have spent 34 minutes in her evaluation and care.  Maanav Kassabian, DO [facility] Direct contact: see [email or web address]  7PM-7AM contact night coverage as above 08/22/2023, 4:24 PM  LOS: 0 days   Consultants  None  Procedures  None  Antibiotics   Anti-infectives (From admission, onward)    Start     Dose/Rate Route Frequency Ordered Stop   08/21/23 1230  cefTRIAXone (ROCEPHIN) 2 g in sodium chloride 0.9 % 100 mL IVPB        2 g 200 mL/hr over 30 Minutes Intravenous Once 08/21/23 1225 08/21/23 1454   08/21/23 1230  metroNIDAZOLE (FLAGYL) IVPB 500 mg        50" 0 mg 100 mL/hr over 60 Minutes Intravenous  Once 08/21/23 1225 08/21/23 1551  Interval History/Subjective  The patient states that she is feeling better. No new complaints.  Objective   Vitals:  Vitals:   08/22/23 0806 08/22/23 1407  BP: (!) 158/96 (!) 154/94  Pulse: 88 73  Resp: 17   Temp: 98.3 F (36.8 C) 98.4 F (36.9 C)  SpO2: 100% 100%      I have personally reviewed the following:   Today's Data   Vitals:   08/22/23 0806 08/22/23 1407  BP: (!) 158/96 (!) 154/94  Pulse: 88 73  Resp: 17   Temp: 98.3 F  (36.8 C) 98.4 F (36.9 C)  SpO2: 100% 100%     Lab Data  CBC    Component Value Date/Time   WBC 4.4 08/22/2023 0538   RBC 3.50 (L) 08/22/2023 0538   HGB 8.8 (L) 08/22/2023 0538   HGB 10.9 (L) 08/21/2018 1500   HCT 29.2 (L) 08/22/2023 0538   HCT 34.7 08/21/2018 1500   PLT 350 08/22/2023 0538   PLT 486 (H) 08/21/2018 1500   MCV 83.4 08/22/2023 0538   MCV 78 (L) 08/21/2018 1500   MCH 25.1 (L) 08/22/2023 0538   MCHC 30.1 08/22/2023 0538   RDW 19.6 (H) 08/22/2023 0538   RDW 19.9 (H) 08/21/2018 1500   LYMPHSABS 1.0 08/21/2023 1143   LYMPHSABS 2.0 08/21/2018 1500   MONOABS 0.4 08/21/2023 1143   EOSABS 0.0 08/21/2023 1143   EOSABS 0.2 08/21/2018 1500   BASOSABS 0.0 08/21/2023 1143   BASOSABS 0.0 08/21/2018 1500      Latest Ref Rng & Units 08/22/2023    5:38 AM 08/21/2023   11:43 AM 06/19/2023   10:29 AM  BMP  Glucose 70 - 99 mg/dL 88  409  811   BUN 6 - 20 mg/dL 10  11  9    Creatinine 0.44 - 1.00 mg/dL 9.14  7.82  9.56   Sodium 135 - 145 mmol/L 134  139  133   Potassium 3.5 - 5.1 mmol/L 3.1  2.9  3.2   Chloride 98 - 111 mmol/L 102  101  100   CO2 22 - 32 mmol/L 24  21  26    Calcium 8.9 - 10.3 mg/dL 7.8  8.8  8.4      Micro Data   Results for orders placed or performed during the hospital encounter of 08/21/23  Resp panel by RT-PCR (RSV, Flu A&B, Covid) Anterior Nasal Swab     Status: None   Collection Time: 08/21/23 11:43 AM   Specimen: Anterior Nasal Swab  Result Value Ref Range Status   SARS Coronavirus 2 by RT PCR NEGATIVE NEGATIVE Final    Comment: (NOTE) SARS-CoV-2 target nucleic acids are NOT DETECTED.  The SARS-CoV-2 RNA is generally detectable in upper respiratory specimens during the acute phase of infection. The lowest concentration of SARS-CoV-2 viral copies this assay can detect is 138 copies/mL. A negative result does not preclude SARS-Cov-2 infection and should not be used as the sole basis for treatment or other patient management decisions. A  negative result may occur with  improper specimen collection/handling, submission of specimen other than nasopharyngeal swab, presence of viral mutation(s) within the areas targeted by this assay, and inadequate number of viral copies(<138 copies/mL). A negative result must be combined with clinical observations, patient history, and epidemiological information. The expected result is Negative.  Fact Sheet for Patients:  BloggerCourse.com  Fact Sheet for Healthcare Providers:  SeriousBroker.it  This test is no t yet approved or cleared by the [place]  FDA and  has been authorized for detection and/or diagnosis of SARS-CoV-2 by FDA under an Emergency Use Authorization (EUA). This EUA will remain  in effect (meaning this test can be used) for the duration of the COVID-19 declaration under Section 564(b)(1) of the Act, 21 U.S.C.section 360bbb-3(b)(1), unless the authorization is terminated  or revoked sooner.       Influenza A by PCR NEGATIVE NEGATIVE Final   Influenza B by PCR NEGATIVE NEGATIVE Final    Comment: (NOTE) The Xpert Xpress SARS-CoV-2/FLU/RSV plus assay is intended as an aid in the diagnosis of influenza from Nasopharyngeal swab specimens and should not be used as a sole basis for treatment. Nasal washings and aspirates are unacceptable for Xpert Xpress SARS-CoV-2/FLU/RSV testing.  Fact Sheet for Patients: BloggerCourse.com  Fact Sheet for Healthcare Providers: SeriousBroker.it  This test is not yet approved or cleared by the [place] FDA and has been authorized for detection and/or diagnosis of SARS-CoV-2 by FDA under an Emergency Use Authorization (EUA). This EUA will remain in effect (meaning this test can be used) for the duration of the COVID-19 declaration under Section 564(b)(1) of the Act, 21 U.S.C. section 360bbb-3(b)(1), unless the authorization  is terminated or revoked.     Resp Syncytial Virus by PCR NEGATIVE NEGATIVE Final    Comment: (NOTE) Fact Sheet for Patients: BloggerCourse.com  Fact Sheet for Healthcare Providers: SeriousBroker.it  This test is not yet approved or cleared by the [place] FDA and has been authorized for detection and/or diagnosis of SARS-CoV-2 by FDA under an Emergency Use Authorization (EUA). This EUA will remain in effect (meaning this test can be used) for the duration of the COVID-19 declaration under Section 564(b)(1) of the Act, 21 U.S.C. section 360bbb-3(b)(1), unless the authorization is terminated or revoked.  Performed at [facility], [address] [place], [place] [address]   Blood Culture (routine x 2)     Status: None (Preliminary result)   Collection Time: 08/21/23  1:27 PM   Specimen: BLOOD LEFT ARM  Result Value Ref Range Status   Specimen Description   Final    BLOOD LEFT ARM Performed at [facility], [address] [place], [place] [address]    Special Requests   Final    BOTTLES DRAWN AEROBIC AND ANAEROBIC Blood Culture adequate volume Performed at [facility], [address], [place], [place] [address]    Culture   Final    NO GROWTH < 24 HOURS Performed at [facility], [address], [place], [place] [address]    Report Status PENDING  Incomplete  Blood Culture (routine x 2)     Status: None (Preliminary result)   Collection Time: 08/21/23  1:27 PM   Specimen: BLOOD RIGHT ARM  Result Value Ref Range Status   Specimen Description   Final    BLOOD RIGHT ARM Performed at [facility], [address], [place], [place] [address]    Special Requests   Final    BOTTLES DRAWN AEROBIC AND ANAEROBIC Blood Culture adequate volume Performed at [facility], [address], [place], [place] [address]    Culture   Final    NO GROWTH < 24  HOURS Performed at [facility], [address], [place], [place] [address]    Report Status PENDING  Incomplete  Gastrointestinal Panel by PCR , Stool     Status: None   Collection Time: 08/22/23  4:28  AM   Specimen: Stool  Result Value Ref Range Status   Campylobacter species NOT DETECTED NOT DETECTED Final   Plesimonas shigelloides NOT DETECTED NOT DETECTED Final   Salmonella species NOT DETECTED NOT DETECTED Final   Yersinia enterocolitica NOT DETECTED NOT DETECTED Final   Vibrio species NOT DETECTED NOT DETECTED Final   Vibrio cholerae NOT DETECTED NOT DETECTED Final   Enteroaggregative E coli (EAEC) NOT DETECTED NOT DETECTED Final   Enteropathogenic E coli (EPEC) NOT DETECTED NOT DETECTED Final   Enterotoxigenic E coli (ETEC) NOT DETECTED NOT DETECTED Final   Shiga like toxin producing E coli (STEC) NOT DETECTED NOT DETECTED Final   Shigella/Enteroinvasive E coli (EIEC) NOT DETECTED NOT DETECTED Final   Cryptosporidium NOT DETECTED NOT DETECTED Final   Cyclospora cayetanensis NOT DETECTED NOT DETECTED Final   Entamoeba histolytica NOT DETECTED NOT DETECTED Final   Giardia lamblia NOT DETECTED NOT DETECTED Final   Adenovirus F40/41 NOT DETECTED NOT DETECTED Final   Astrovirus NOT DETECTED NOT DETECTED Final   Norovirus GI/GII NOT DETECTED NOT DETECTED Final   Rotavirus A NOT DETECTED NOT DETECTED Final   Sapovirus (I, II, IV, and V) NOT DETECTED NOT DETECTED Final    Comment: Performed at [facility], [address] [place], [place] [address]    Scheduled Meds:  enoxaparin (LOVENOX) injection  40 mg Subcutaneous Q24H   Continuous Infusions:  Principal Problem:   Gastroenteritis Active Problems:   Lactic acid acidosis   Gastroenteritis and colitis, viral   LOS: 0 days

## 2023-08-23 DIAGNOSIS — K529 Noninfective gastroenteritis and colitis, unspecified: Secondary | ICD-10-CM | POA: Diagnosis not present

## 2023-08-23 LAB — CBC WITH DIFFERENTIAL/PLATELET
Abs Immature Granulocytes: 0 10*3/uL (ref 0.00–0.07)
Basophils Absolute: 0 10*3/uL (ref 0.0–0.1)
Basophils Relative: 1 %
Eosinophils Absolute: 0.1 10*3/uL (ref 0.0–0.5)
Eosinophils Relative: 3 %
HCT: 30.3 % — ABNORMAL LOW (ref 36.0–46.0)
Hemoglobin: 9.1 g/dL — ABNORMAL LOW (ref 12.0–15.0)
Immature Granulocytes: 0 %
Lymphocytes Relative: 26 %
Lymphs Abs: 1.1 10*3/uL (ref 0.7–4.0)
MCH: 24.9 pg — ABNORMAL LOW (ref 26.0–34.0)
MCHC: 30 g/dL (ref 30.0–36.0)
MCV: 83 fL (ref 80.0–100.0)
Monocytes Absolute: 0.4 10*3/uL (ref 0.1–1.0)
Monocytes Relative: 9 %
Neutro Abs: 2.5 10*3/uL (ref 1.7–7.7)
Neutrophils Relative %: 61 %
Platelets: 354 10*3/uL (ref 150–400)
RBC: 3.65 MIL/uL — ABNORMAL LOW (ref 3.87–5.11)
RDW: 19.6 % — ABNORMAL HIGH (ref 11.5–15.5)
WBC: 4.1 10*3/uL (ref 4.0–10.5)
nRBC: 0 % (ref 0.0–0.2)

## 2023-08-23 LAB — TROPONIN I (HIGH SENSITIVITY)
Troponin I (High Sensitivity): <2 ng/L (ref <18)
Troponin I (High Sensitivity): 2 ng/L (ref <18)

## 2023-08-23 LAB — BASIC METABOLIC PANEL
Anion gap: 11 (ref 5–15)
BUN: <5 mg/dL — ABNORMAL LOW (ref 6–20)
CO2: 22 mmol/L (ref 22–32)
Calcium: 8.4 mg/dL — ABNORMAL LOW (ref 8.9–10.3)
Chloride: 105 mmol/L (ref 98–111)
Creatinine, Ser: 0.47 mg/dL (ref 0.44–1.00)
GFR, Estimated: >60 mL/min (ref >60)
Glucose, Bld: 87 mg/dL (ref 70–99)
Potassium: 3.8 mmol/L (ref 3.5–5.1)
Sodium: 138 mmol/L (ref 135–145)

## 2023-08-23 MED ORDER — LOPERAMIDE HCL 2 MG PO CAPS
4.0000 mg | ORAL_CAPSULE | Freq: Every day | ORAL | Status: DC
Start: 2023-08-23 — End: 2023-08-26
  Administered 2023-08-23 – 2023-08-26 (×4): 4 mg via ORAL
  Filled 2023-08-23 (×4): qty 2

## 2023-08-23 MED ORDER — MELATONIN 5 MG PO TABS
5.0000 mg | ORAL_TABLET | Freq: Once | ORAL | Status: AC
  Administered 2023-08-23: 5 mg via ORAL
  Filled 2023-08-23: qty 1

## 2023-08-23 MED ORDER — LOPERAMIDE HCL 2 MG PO CAPS: 2.0000 mg | ORAL_CAPSULE | ORAL | Status: DC | PRN

## 2023-08-23 NOTE — Progress Notes (Signed)
   08/23/23 0857  TOC Brief Assessment  Insurance and Status Reviewed  Patient has primary care physician Yes  Home environment has been reviewed Home w/ spouse  Prior level of function: Independent  Prior/Current Home Services No current home services  Social Drivers of Health Review SDOH reviewed no interventions necessary  Readmission risk has been reviewed Yes  Transition of care needs no transition of care needs at this time

## 2023-08-23 NOTE — Progress Notes (Signed)
PROGRESS NOTE  Cheryl Daniels WUJ:[id number] DOB: July 03, 1981 DOA: 08/21/2023 PCP: Murlean Caller, MD  Brief History   The patient is a 43 yr old woman who presented to [facility] with complaints of NVD and worsening right sided back pain. She has been vomiting and unable to keep down any food for 4-5 days. She had a fever of 102.5 on Sunday. She has also had 2-4 watery stool, day.   In the ED the patient was found to be tachycardic, hypokalemic at 2.9, AGMA, negative COVID, Flu, and RSV. Lactic acid was elevated at 3.5 to 2.1 and 3.6. The patient was started on the sepsis protocol and received IV fluids, IV Rocephin and IV Flagyl. She received Zofran and potassium replacement.   This morning the patient states that she is feeling better. She states that she is ready to advance her diet. She feels that diarrhea is resolving.  A & P  Assessment/Plan Cheryl Daniels is a 42 y.o. female with medical history significant for asthma, HTN, cryptosporidial gastroenteritis, iron deficiency anemia, sepsis, folate deficiency and allergies who presented to the ED for evaluation of N/V/D, and worsening right-sided back pain and admitted for gastroenteritis   Gastroenteritis SIRS Lactic acidosis Resolved SIRS and Lactic Acidosis is resolved. Patient with history of Cryptosporidium gastroenteritis now presenting with 4-5 days of nausea, vomiting and diarrhea.  Patient with tachycardia and an elevated lactic acid likely due to severe dehydration but no leukocytosis, fever and no evidence of urinary, respiratory or intra-abdominal infection. - Enteric panel is negative. -Hold off on further antibiotics for now -stop IV antibiotics -Enteric precautions   Hypokalemia Hypomagnesemia #AGMA Resolved. Supplement Magnesium, Potassium, and phos.   Iron deficiency anemia Folate deficiency Has received iron infusions in the past.  Baseline Hgb of 8-9.  Hgb 11.4 likely due to hemoconcentration. -Check iron studies,  ferritin, folate and vitamin B12 -Trend CBC   Back muscle strain Patient reports ongoing right lower back pain and spasms likely secondary to constant retching.  There is moderate tenderness to palpation of the paraspinal muscles. -As needed IV Dilaudid for pain   HTN BP elevated with SBP in the 130s to 140s in the setting of pain and constant nausea vomiting -CTM   Leiomyomas CT A/P shows several ill-defined lesions favored to represent leiomyomas but normal ovaries and no adnexal mass. Reports her menstrual cycle started today, which is likely contributing to her back pain/spasms. -Follow-up with OB/GYN in the outpatient     DVT prophylaxis: Lovenox      Code Status: Full Code   Consults called: None   Family Communication: No family at bedside    I have seen and examined this patient myself. I have spent 34 minutes in her evaluation and care.  Gennett Garcia, DO [facility] Direct contact: see [email or web address]  7PM-7AM contact night coverage as above 08/23/2023, 5:12 PM  LOS: 1 day   Consultants  None  Procedures  None  Antibiotics   Anti-infectives (From admission, onward)    Start     Dose/Rate Route Frequency Ordered Stop   08/21/23 1230  cefTRIAXone (ROCEPHIN) 2 g in sodium chloride 0.9 % 100 mL IVPB        2 g 200 mL/hr over 30 Minutes Intravenous Once 08/21/23 1225 08/21/23 1454   08/21/23 1230  metroNIDAZOLE (FLAGYL) IVPB 500 mg        50 0 mg 100 mL/hr over 60 Minutes Intravenous  Once 08/21/23 1225 08/21/23 1551  Interval History/Subjective  The patient states that she is feeling better. No new complaints.  Objective   Vitals:  Vitals:   08/23/23 0437 08/23/23 1601  BP: (!) 154/90 (!) 164/103  Pulse: 70 81  Resp: 16 16  Temp: 98.2 F (36.8 C) 98 F (36.7 C)  SpO2: 100% 100%      I have personally reviewed the following:   Today's Data   Vitals:   08/23/23 0437 08/23/23 1601  BP: (!) 154/90 (!) 164/103  Pulse: 70 81   Resp: 16 16  Temp: 98.2 F (36.8 C) 98 F (36.7 C)  SpO2: 100% 100%     Lab Data  CBC    Component Value Date/Time   WBC 4.1 08/23/2023 0924   RBC 3.65 (L) 08/23/2023 0924   HGB 9.1 (L) 08/23/2023 0924   HGB 10.9 (L) 08/21/2018 1500   HCT 30.3 (L) 08/23/2023 0924   HCT 34.7 08/21/2018 1500   PLT 354 08/23/2023 0924   PLT 486 (H) 08/21/2018 1500   MCV 83.0 08/23/2023 0924   MCV 78 (L) 08/21/2018 1500   MCH 24.9 (L) 08/23/2023 0924   MCHC 30.0 08/23/2023 0924   RDW 19.6 (H) 08/23/2023 0924   RDW 19.9 (H) 08/21/2018 1500   LYMPHSABS 1.1 08/23/2023 0924   LYMPHSABS 2.0 08/21/2018 1500   MONOABS 0.4 08/23/2023 0924   EOSABS 0.1 08/23/2023 0924   EOSABS 0.2 08/21/2018 1500   BASOSABS 0.0 08/23/2023 0924   BASOSABS 0.0 08/21/2018 1500      Latest Ref Rng & Units 08/23/2023    9:24 AM 08/22/2023    5:38 AM 08/21/2023   11:43 AM  BMP  Glucose 70 - 99 mg/dL 87  88  161   BUN 6 - 20 mg/dL 5  10  11    Creatinine 0.44 - 1.00 mg/dL 0.96  0.45  4.09   Sodium 135 - 145 mmol/L 138  134  139   Potassium 3.5 - 5.1 mmol/L 3.8  3.1  2.9   Chloride 98 - 111 mmol/L 105  102  101   CO2 22 - 32 mmol/L 22  24  21    Calcium 8.9 - 10.3 mg/dL 8.4  7.8  8.8      Micro Data   Results for orders placed or performed during the hospital encounter of 08/21/23  Resp panel by RT-PCR (RSV, Flu A&B, Covid) Anterior Nasal Swab     Status: None   Collection Time: 08/21/23 11:43 AM   Specimen: Anterior Nasal Swab  Result Value Ref Range Status   SARS Coronavirus 2 by RT PCR NEGATIVE NEGATIVE Final    Comment: (NOTE) SARS-CoV-2 target nucleic acids are NOT DETECTED.  The SARS-CoV-2 RNA is generally detectable in upper respiratory specimens during the acute phase of infection. The lowest concentration of SARS-CoV-2 viral copies this assay can detect is 138 copies/mL. A negative result does not preclude SARS-Cov-2 infection and should not be used as the sole basis for treatment or other patient  management decisions. A negative result may occur with  improper specimen collection/handling, submission of specimen other than nasopharyngeal swab, presence of viral mutation(s) within the areas targeted by this assay, and inadequate number of viral copies(<138 copies/mL). A negative result must be combined with clinical observations, patient history, and epidemiological information. The expected result is Negative.  Fact Sheet for Patients:  BloggerCourse.com  Fact Sheet for Healthcare Providers:  SeriousBroker.it  This test is no t yet approved or cleared by the [place]  States FDA and  has been authorized for detection and/or diagnosis of SARS-CoV-2 by FDA under an Emergency Use Authorization (EUA). This EUA will remain  in effect (meaning this test can be used) for the duration of the COVID-19 declaration under Section 564(b)(1) of the Act, 21 U.S.C.section 360bbb-3(b)(1), unless the authorization is terminated  or revoked sooner.       Influenza A by PCR NEGATIVE NEGATIVE Final   Influenza B by PCR NEGATIVE NEGATIVE Final    Comment: (NOTE) The Xpert Xpress SARS-CoV-2/FLU/RSV plus assay is intended as an aid in the diagnosis of influenza from Nasopharyngeal swab specimens and should not be used as a sole basis for treatment. Nasal washings and aspirates are unacceptable for Xpert Xpress SARS-CoV-2/FLU/RSV testing.  Fact Sheet for Patients: BloggerCourse.com  Fact Sheet for Healthcare Providers: SeriousBroker.it  This test is not yet approved or cleared by the [place] FDA and has been authorized for detection and/or diagnosis of SARS-CoV-2 by FDA under an Emergency Use Authorization (EUA). This EUA will remain in effect (meaning this test can be used) for the duration of the COVID-19 declaration under Section 564(b)(1) of the Act, 21 U.S.C. section 360bbb-3(b)(1),  unless the authorization is terminated or revoked.     Resp Syncytial Virus by PCR NEGATIVE NEGATIVE Final    Comment: (NOTE) Fact Sheet for Patients: BloggerCourse.com  Fact Sheet for Healthcare Providers: SeriousBroker.it  This test is not yet approved or cleared by the [place] FDA and has been authorized for detection and/or diagnosis of SARS-CoV-2 by FDA under an Emergency Use Authorization (EUA). This EUA will remain in effect (meaning this test can be used) for the duration of the COVID-19 declaration under Section 564(b)(1) of the Act, 21 U.S.C. section 360bbb-3(b)(1), unless the authorization is terminated or revoked.  Performed at [facility], [address], [place], [place] [address]   Blood Culture (routine x 2)     Status: None (Preliminary result)   Collection Time: 08/21/23  1:27 PM   Specimen: BLOOD LEFT ARM  Result Value Ref Range Status   Specimen Description   Final    BLOOD LEFT ARM Performed at [facility], [address] [place], [place] [address]    Special Requests   Final    BOTTLES DRAWN AEROBIC AND ANAEROBIC Blood Culture adequate volume Performed at [facility], [address], [place], [place] [address]    Culture   Final    NO GROWTH 2 DAYS Performed at [facility], [address], [place], [place] [address]    Report Status PENDING  Incomplete  Blood Culture (routine x 2)     Status: None (Preliminary result)   Collection Time: 08/21/23  1:27 PM   Specimen: BLOOD RIGHT ARM  Result Value Ref Range Status   Specimen Description   Final    BLOOD RIGHT ARM Performed at [facility], [address], [place], [place] [address]    Special Requests   Final    BOTTLES DRAWN AEROBIC AND ANAEROBIC Blood Culture adequate volume Performed at [facility], [address] [place], [place] [address]    Culture    Final    NO GROWTH 2 DAYS Performed at [facility], [address] [place], [place] [address]    Report Status PENDING  Incomplete  Gastrointestinal Panel by PCR , Stool     Status: None   Collection Time: 08/22/23  4:28 AM  Specimen: Stool  Result Value Ref Range Status   Campylobacter species NOT DETECTED NOT DETECTED Final   Plesimonas shigelloides NOT DETECTED NOT DETECTED Final   Salmonella species NOT DETECTED NOT DETECTED Final   Yersinia enterocolitica NOT DETECTED NOT DETECTED Final   Vibrio species NOT DETECTED NOT DETECTED Final   Vibrio cholerae NOT DETECTED NOT DETECTED Final   Enteroaggregative E coli (EAEC) NOT DETECTED NOT DETECTED Final   Enteropathogenic E coli (EPEC) NOT DETECTED NOT DETECTED Final   Enterotoxigenic E coli (ETEC) NOT DETECTED NOT DETECTED Final   Shiga like toxin producing E coli (STEC) NOT DETECTED NOT DETECTED Final   Shigella/Enteroinvasive E coli (EIEC) NOT DETECTED NOT DETECTED Final   Cryptosporidium NOT DETECTED NOT DETECTED Final   Cyclospora cayetanensis NOT DETECTED NOT DETECTED Final   Entamoeba histolytica NOT DETECTED NOT DETECTED Final   Giardia lamblia NOT DETECTED NOT DETECTED Final   Adenovirus F40/41 NOT DETECTED NOT DETECTED Final   Astrovirus NOT DETECTED NOT DETECTED Final   Norovirus GI/GII NOT DETECTED NOT DETECTED Final   Rotavirus A NOT DETECTED NOT DETECTED Final   Sapovirus (I, II, IV, and V) NOT DETECTED NOT DETECTED Final    Comment: Performed at [facility], [address] [place], [place] [address]    Scheduled Meds:  enoxaparin (LOVENOX) injection  40 mg Subcutaneous Q24H   loperamide  4 mg Oral Daily   Continuous Infusions:  Principal Problem:   Gastroenteritis Active Problems:   Lactic acid acidosis   Gastroenteritis and colitis, viral   LOS: 1 day

## 2023-08-24 DIAGNOSIS — K529 Noninfective gastroenteritis and colitis, unspecified: Secondary | ICD-10-CM | POA: Diagnosis not present

## 2023-08-24 LAB — BASIC METABOLIC PANEL
Anion gap: 8 (ref 5–15)
BUN: 7 mg/dL (ref 6–20)
CO2: 25 mmol/L (ref 22–32)
Calcium: 8.4 mg/dL — ABNORMAL LOW (ref 8.9–10.3)
Chloride: 103 mmol/L (ref 98–111)
Creatinine, Ser: 0.66 mg/dL (ref 0.44–1.00)
GFR, Estimated: >60 mL/min (ref >60)
Glucose, Bld: 89 mg/dL (ref 70–99)
Potassium: 3.6 mmol/L (ref 3.5–5.1)
Sodium: 136 mmol/L (ref 135–145)

## 2023-08-24 LAB — RESPIRATORY PANEL BY PCR

## 2023-08-24 LAB — CBC WITH DIFFERENTIAL/PLATELET
Abs Immature Granulocytes: 0.01 10*3/uL (ref 0.00–0.07)
Basophils Absolute: 0 10*3/uL (ref 0.0–0.1)
Basophils Relative: 0 %
Eosinophils Absolute: 0.1 10*3/uL (ref 0.0–0.5)
Eosinophils Relative: 3 %
HCT: 30.7 % — ABNORMAL LOW (ref 36.0–46.0)
Hemoglobin: 9.3 g/dL — ABNORMAL LOW (ref 12.0–15.0)
Immature Granulocytes: 0 %
Lymphocytes Relative: 27 %
Lymphs Abs: 1.3 10*3/uL (ref 0.7–4.0)
MCH: 25 pg — ABNORMAL LOW (ref 26.0–34.0)
MCHC: 30.3 g/dL (ref 30.0–36.0)
MCV: 82.5 fL (ref 80.0–100.0)
Monocytes Absolute: 0.3 10*3/uL (ref 0.1–1.0)
Monocytes Relative: 7 %
Neutro Abs: 3 10*3/uL (ref 1.7–7.7)
Neutrophils Relative %: 63 %
Platelets: 369 10*3/uL (ref 150–400)
RBC: 3.72 MIL/uL — ABNORMAL LOW (ref 3.87–5.11)
RDW: 19.5 % — ABNORMAL HIGH (ref 11.5–15.5)
WBC: 4.7 10*3/uL (ref 4.0–10.5)
nRBC: 0 % (ref 0.0–0.2)

## 2023-08-24 LAB — SARS CORONAVIRUS 2 BY RT PCR: SARS Coronavirus 2 by RT PCR: NEGATIVE

## 2023-08-24 MED ORDER — LIDOCAINE 5 % EX PTCH
1.0000 | MEDICATED_PATCH | CUTANEOUS | Status: DC
  Administered 2023-08-24: 1 via TRANSDERMAL
  Filled 2023-08-24: qty 1

## 2023-08-24 MED ORDER — MELATONIN 5 MG PO TABS: 5.0000 mg | ORAL_TABLET | Freq: Every evening | ORAL | Status: DC | PRN

## 2023-08-24 MED ORDER — HYDROMORPHONE HCL 1 MG/ML IJ SOLN
0.5000 mg | INTRAMUSCULAR | Status: DC | PRN
  Administered 2023-08-24 – 2023-08-26 (×9): 0.5 mg via INTRAVENOUS
  Filled 2023-08-24 (×9): qty 0.5

## 2023-08-24 NOTE — Plan of Care (Signed)
  Problem: Education: Goal: Knowledge of General Education information will improve Description: Including pain rating scale, medication(s)/side effects and non-pharmacologic comfort measures Outcome: Progressing   Problem: Health Behavior/Discharge Planning: Goal: Ability to manage health-related needs will improve Outcome: Progressing   Problem: Clinical Measurements: Goal: Diagnostic test results will improve Outcome: Progressing Goal: Cardiovascular complication will be avoided Outcome: Progressing   Problem: Nutrition: Goal: Adequate nutrition will be maintained Outcome: Progressing   Problem: Coping: Goal: Level of anxiety will decrease Outcome: Progressing   Problem: Elimination: Goal: Will not experience complications related to bowel motility Outcome: Progressing   Problem: Pain Managment: Goal: General experience of comfort will improve and/or be controlled Outcome: Progressing   Problem: Safety: Goal: Ability to remain free from injury will improve Outcome: Progressing   Problem: Skin Integrity: Goal: Risk for impaired skin integrity will decrease Outcome: Progressing

## 2023-08-24 NOTE — Progress Notes (Signed)
PROGRESS NOTE  DIAVION LABRADOR XWR:[id number] DOB: 09-03-1980 DOA: 08/21/2023 PCP: Murlean Caller, MD  Brief History   The patient is a 43 yr old woman who presented to [facility] with complaints of NVD and worsening right sided back pain. She has been vomiting and unable to keep down any food for 4-5 days. She had a fever of 102.5 on Sunday. She has also had 2-4 watery stool, day.   In the ED the patient was found to be tachycardic, hypokalemic at 2.9, AGMA, negative COVID, Flu, and RSV. Lactic acid was elevated at 3.5 to 2.1 and 3.6. The patient was started on the sepsis protocol and received IV fluids, IV Rocephin and IV Flagyl. She received Zofran and potassium replacement.   This morning the patient states that she is feeling better. She states that she is ready to advance her diet. She feels that diarrhea is resolving.  A & P  Assessment/Plan Illene L Aker is a 42 y.o. female with medical history significant for asthma, HTN, cryptosporidial gastroenteritis, iron deficiency anemia, sepsis, folate deficiency and allergies who presented to the ED for evaluation of N/V/D, and worsening right-sided back pain and admitted for gastroenteritis   Gastroenteritis SIRS Lactic acidosis Resolved SIRS and Lactic Acidosis is resolved. Patient with history of Cryptosporidium gastroenteritis now presenting with 4-5 days of nausea, vomiting and diarrhea.  Patient with tachycardia and an elevated lactic acid likely due to severe dehydration but no leukocytosis, fever and no evidence of urinary, respiratory or intra-abdominal infection. - Enteric panel is negative. -Hold off on further antibiotics for now -stop IV antibiotics -Enteric precautions   Hypokalemia Hypomagnesemia #AGMA Resolved. Supplement Magnesium, Potassium, and phos.   Iron deficiency anemia Folate deficiency Has received iron infusions in the past.  Baseline Hgb of 8-9.  Hgb 11.4 likely due to hemoconcentration. -Check iron studies,  ferritin, folate and vitamin B12 -Trend CBC   Back muscle strain Patient reports ongoing right lower back pain and spasms likely secondary to constant retching.  There is moderate tenderness to palpation of the paraspinal muscles. -As needed IV Dilaudid for pain   HTN BP elevated with SBP in the 130s to 140s in the setting of pain and constant nausea vomiting -CTM   Leiomyomas CT A/P shows several ill-defined lesions favored to represent leiomyomas but normal ovaries and no adnexal mass. Reports her menstrual cycle started today, which is likely contributing to her back pain/spasms. -Follow-up with OB/GYN in the outpatient     DVT prophylaxis: Lovenox      Code Status: Full Code   Consults called: None   Family Communication: No family at bedside    I have seen and examined this patient myself. I have spent 34 minutes in her evaluation and care.  Kevontae Burgoon, DO [facility] Direct contact: see [email or web address]  7PM-7AM contact night coverage as above 08/24/2023, 6:06 PM  LOS: 2 days   Consultants  None  Procedures  None  Antibiotics   Anti-infectives (From admission, onward)    Start     Dose/Rate Route Frequency Ordered Stop   08/21/23 1230  cefTRIAXone (ROCEPHIN) 2 g in sodium chloride 0.9 % 100 mL IVPB        2 g 200 mL/hr over 30 Minutes Intravenous Once 08/21/23 1225 08/21/23 1454   08/21/23 1230  metroNIDAZOLE (FLAGYL) IVPB 500 mg        50 0 mg 100 mL/hr over 60 Minutes Intravenous  Once 08/21/23 1225 08/21/23 1551  Interval History/Subjective  The patient states that she is feeling better. No new complaints.  Objective   Vitals:  Vitals:   08/24/23 0502 08/24/23 1239  BP: (!) 153/106 (!) 171/95  Pulse: 77 77  Resp: 18 18  Temp: 98.3 F (36.8 C) 98 F (36.7 C)  SpO2: 100% 100%      I have personally reviewed the following:   Today's Data   Vitals:   08/24/23 0502 08/24/23 1239  BP: (!) 153/106 (!) 171/95  Pulse: 77 77   Resp: 18 18  Temp: 98.3 F (36.8 C) 98 F (36.7 C)  SpO2: 100% 100%     Lab Data  CBC    Component Value Date/Time   WBC 4.7 08/24/2023 0640   RBC 3.72 (L) 08/24/2023 0640   HGB 9.3 (L) 08/24/2023 0640   HGB 10.9 (L) 08/21/2018 1500   HCT 30.7 (L) 08/24/2023 0640   HCT 34.7 08/21/2018 1500   PLT 369 08/24/2023 0640   PLT 486 (H) 08/21/2018 1500   MCV 82.5 08/24/2023 0640   MCV 78 (L) 08/21/2018 1500   MCH 25.0 (L) 08/24/2023 0640   MCHC 30.3 08/24/2023 0640   RDW 19.5 (H) 08/24/2023 0640   RDW 19.9 (H) 08/21/2018 1500   LYMPHSABS 1.3 08/24/2023 0640   LYMPHSABS 2.0 08/21/2018 1500   MONOABS 0.3 08/24/2023 0640   EOSABS 0.1 08/24/2023 0640   EOSABS 0.2 08/21/2018 1500   BASOSABS 0.0 08/24/2023 0640   BASOSABS 0.0 08/21/2018 1500      Latest Ref Rng & Units 08/24/2023    6:40 AM 08/23/2023    9:24 AM 08/22/2023    5:38 AM  BMP  Glucose 70 - 99 mg/dL 89  87  88   BUN 6 - 20 mg/dL 7  <5  10   Creatinine 0.44 - 1.00 mg/dL 9.81  1.91  4.78   Sodium 135 - 145 mmol/L 136  138  134   Potassium 3.5 - 5.1 mmol/L 3.6  3.8  3.1   Chloride 98 - 111 mmol/L 103  105  102   CO2 22 - 32 mmol/L 25  22  24    Calcium 8.9 - 10.3 mg/dL 8.4  8.4  7.8      Micro Data   Results for orders placed or performed during the hospital encounter of 08/21/23  Resp panel by RT-PCR (RSV, Flu A&B, Covid) Anterior Nasal Swab     Status: None   Collection Time: 08/21/23 11:43 AM   Specimen: Anterior Nasal Swab  Result Value Ref Range Status   SARS Coronavirus 2 by RT PCR NEGATIVE NEGATIVE Final    Comment: (NOTE) SARS-CoV-2 target nucleic acids are NOT DETECTED.  The SARS-CoV-2 RNA is generally detectable in upper respiratory specimens during the acute phase of infection. The lowest concentration of SARS-CoV-2 viral copies this assay can detect is 138 copies/mL. A negative result does not preclude SARS-Cov-2 infection and should not be used as the sole basis for treatment or other patient  management decisions. A negative result may occur with  improper specimen collection/handling, submission of specimen other than nasopharyngeal swab, presence of viral mutation(s) within the areas targeted by this assay, and inadequate number of viral copies(<138 copies/mL). A negative result must be combined with clinical observations, patient history, and epidemiological information. The expected result is Negative.  Fact Sheet for Patients:  BloggerCourse.com  Fact Sheet for Healthcare Providers:  SeriousBroker.it  This test is no t yet approved or cleared by the  [place] [employer] and  has been authorized for detection and/or diagnosis of SARS-CoV-2 by FDA under an TEFL teacher (EUA). This EUA will remain  in effect (meaning this test can be used) for the duration of the COVID-19 declaration under Section 564(b)(1) of the Act, 21 U.S.C.section 360bbb-3(b)(1), unless the authorization is terminated  or revoked sooner.       Influenza A by PCR NEGATIVE NEGATIVE Final   Influenza B by PCR NEGATIVE NEGATIVE Final    Comment: (NOTE) The Xpert Xpress SARS-CoV-2/FLU/RSV plus assay is intended as an aid in the diagnosis of influenza from Nasopharyngeal swab specimens and should not be used as a sole basis for treatment. Nasal washings and aspirates are unacceptable for Xpert Xpress SARS-CoV-2/FLU/RSV testing.  Fact Sheet for Patients: BloggerCourse.com  Fact Sheet for Healthcare Providers: SeriousBroker.it  This test is not yet approved or cleared by the [place] FDA and has been authorized for detection and/or diagnosis of SARS-CoV-2 by FDA under an Emergency Use Authorization (EUA). This EUA will remain in effect (meaning this test can be used) for the duration of the COVID-19 declaration under Section 564(b)(1) of the Act, 21 U.S.C. section 360bbb-3(b)(1),  unless the authorization is terminated or revoked.     Resp Syncytial Virus by PCR NEGATIVE NEGATIVE Final    Comment: (NOTE) Fact Sheet for Patients: BloggerCourse.com  Fact Sheet for Healthcare Providers: SeriousBroker.it  This test is not yet approved or cleared by the [place] FDA and has been authorized for detection and/or diagnosis of SARS-CoV-2 by FDA under an Emergency Use Authorization (EUA). This EUA will remain in effect (meaning this test can be used) for the duration of the COVID-19 declaration under Section 564(b)(1) of the Act, 21 U.S.C. section 360bbb-3(b)(1), unless the authorization is terminated or revoked.  Performed at [facility], [address], [place], [place] [address]   Blood Culture (routine x 2)     Status: None (Preliminary result)   Collection Time: 08/21/23  1:27 PM   Specimen: BLOOD LEFT ARM  Result Value Ref Range Status   Specimen Description   Final    BLOOD LEFT ARM Performed at [facility], [address], [place], [place] [address]    Special Requests   Final    BOTTLES DRAWN AEROBIC AND ANAEROBIC Blood Culture adequate volume Performed at [facility], [address] [place], [place] [address]    Culture   Final    NO GROWTH 3 DAYS Performed at [facility], [address], [place], [place] [address]    Report Status PENDING  Incomplete  Blood Culture (routine x 2)     Status: None (Preliminary result)   Collection Time: 08/21/23  1:27 PM   Specimen: BLOOD RIGHT ARM  Result Value Ref Range Status   Specimen Description   Final    BLOOD RIGHT ARM Performed at [facility], [address], [place], [place] [address]    Special Requests   Final    BOTTLES DRAWN AEROBIC AND ANAEROBIC Blood Culture adequate volume Performed at [facility], [address] [place], [place] [address]    Culture    Final    NO GROWTH 3 DAYS Performed at [facility], [address] [place], [place] [address]    Report Status PENDING  Incomplete  Gastrointestinal Panel by PCR , Stool     Status: None   Collection Time: 08/22/23  4:28  AM   Specimen: Stool  Result Value Ref Range Status   Campylobacter species NOT DETECTED NOT DETECTED Final   Plesimonas shigelloides NOT DETECTED NOT DETECTED Final   Salmonella species NOT DETECTED NOT DETECTED Final   Yersinia enterocolitica NOT DETECTED NOT DETECTED Final   Vibrio species NOT DETECTED NOT DETECTED Final   Vibrio cholerae NOT DETECTED NOT DETECTED Final   Enteroaggregative E coli (EAEC) NOT DETECTED NOT DETECTED Final   Enteropathogenic E coli (EPEC) NOT DETECTED NOT DETECTED Final   Enterotoxigenic E coli (ETEC) NOT DETECTED NOT DETECTED Final   Shiga like toxin producing E coli (STEC) NOT DETECTED NOT DETECTED Final   Shigella/Enteroinvasive E coli (EIEC) NOT DETECTED NOT DETECTED Final   Cryptosporidium NOT DETECTED NOT DETECTED Final   Cyclospora cayetanensis NOT DETECTED NOT DETECTED Final   Entamoeba histolytica NOT DETECTED NOT DETECTED Final   Giardia lamblia NOT DETECTED NOT DETECTED Final   Adenovirus F40/41 NOT DETECTED NOT DETECTED Final   Astrovirus NOT DETECTED NOT DETECTED Final   Norovirus GI/GII NOT DETECTED NOT DETECTED Final   Rotavirus A NOT DETECTED NOT DETECTED Final   Sapovirus (I, II, IV, and V) NOT DETECTED NOT DETECTED Final    Comment: Performed at [facility], [address] [place], [place] [address]  SARS Coronavirus 2 by RT PCR (hospital order, performed in [facility] hospital lab) *cepheid single result test* Anterior Nasal Swab     Status: None   Collection Time: 08/24/23 12:29 PM   Specimen: Anterior Nasal Swab  Result Value Ref Range Status   SARS Coronavirus 2 by RT PCR NEGATIVE NEGATIVE Final    Comment: (NOTE) SARS-CoV-2 target nucleic acids are NOT DETECTED.  The SARS-CoV-2 RNA is  generally detectable in upper and lower respiratory specimens during the acute phase of infection. The lowest concentration of SARS-CoV-2 viral copies this assay can detect is 250 copies / mL. A negative result does not preclude SARS-CoV-2 infection and should not be used as the sole basis for treatment or other patient management decisions.  A negative result may occur with improper specimen collection / handling, submission of specimen other than nasopharyngeal swab, presence of viral mutation(s) within the areas targeted by this assay, and inadequate number of viral copies (<250 copies / mL). A negative result must be combined with clinical observations, patient history, and epidemiological information.  Fact Sheet for Patients:   RoadLapTop.co.za  Fact Sheet for Healthcare Providers: http://kim-miller.com/  This test is not yet approved or  cleared by the [place] FDA and has been authorized for detection and/or diagnosis of SARS-CoV-2 by FDA under an Emergency Use Authorization (EUA).  This EUA will remain in effect (meaning this test can be used) for the duration of the COVID-19 declaration under Section 564(b)(1) of the Act, 21 U.S.C. section 360bbb-3(b)(1), unless the authorization is terminated or revoked sooner.  Performed at [facility], [address], [place], [place] [address]     Scheduled Meds:  enoxaparin (LOVENOX) injection  40 mg Subcutaneous Q24H   loperamide  4 mg Oral Daily   Continuous Infusions:  Principal Problem:   Gastroenteritis Active Problems:   Lactic acid acidosis   Gastroenteritis and colitis, viral   LOS: 2 days

## 2023-08-24 NOTE — Plan of Care (Signed)
   Problem: Education: Goal: Knowledge of General Education information will improve Description: Including pain rating scale, medication(s)/side effects and non-pharmacologic comfort measures Outcome: Progressing   Problem: Clinical Measurements: Goal: Ability to maintain clinical measurements within normal limits will improve Outcome: Progressing Goal: Diagnostic test results will improve Outcome: Progressing

## 2023-08-25 DIAGNOSIS — E872 Acidosis, unspecified: Secondary | ICD-10-CM | POA: Diagnosis not present

## 2023-08-25 DIAGNOSIS — K529 Noninfective gastroenteritis and colitis, unspecified: Secondary | ICD-10-CM | POA: Diagnosis not present

## 2023-08-25 DIAGNOSIS — A084 Viral intestinal infection, unspecified: Secondary | ICD-10-CM | POA: Diagnosis not present

## 2023-08-25 LAB — BASIC METABOLIC PANEL
Anion gap: 7 (ref 5–15)
BUN: 7 mg/dL (ref 6–20)
CO2: 25 mmol/L (ref 22–32)
Calcium: 8.7 mg/dL — ABNORMAL LOW (ref 8.9–10.3)
Chloride: 103 mmol/L (ref 98–111)
Creatinine, Ser: 0.76 mg/dL (ref 0.44–1.00)
GFR, Estimated: >60 mL/min (ref >60)
Glucose, Bld: 90 mg/dL (ref 70–99)
Potassium: 3.6 mmol/L (ref 3.5–5.1)
Sodium: 135 mmol/L (ref 135–145)

## 2023-08-25 LAB — CBC WITH DIFFERENTIAL/PLATELET
Abs Immature Granulocytes: 0.01 10*3/uL (ref 0.00–0.07)
Basophils Absolute: 0 10*3/uL (ref 0.0–0.1)
Basophils Relative: 1 %
Eosinophils Absolute: 0.2 10*3/uL (ref 0.0–0.5)
Eosinophils Relative: 3 %
HCT: 31.5 % — ABNORMAL LOW (ref 36.0–46.0)
Hemoglobin: 9.6 g/dL — ABNORMAL LOW (ref 12.0–15.0)
Immature Granulocytes: 0 %
Lymphocytes Relative: 38 %
Lymphs Abs: 2.1 10*3/uL (ref 0.7–4.0)
MCH: 25.1 pg — ABNORMAL LOW (ref 26.0–34.0)
MCHC: 30.5 g/dL (ref 30.0–36.0)
MCV: 82.2 fL (ref 80.0–100.0)
Monocytes Absolute: 0.4 10*3/uL (ref 0.1–1.0)
Monocytes Relative: 8 %
Neutro Abs: 2.8 10*3/uL (ref 1.7–7.7)
Neutrophils Relative %: 50 %
Platelets: 364 10*3/uL (ref 150–400)
RBC: 3.83 MIL/uL — ABNORMAL LOW (ref 3.87–5.11)
RDW: 19.6 % — ABNORMAL HIGH (ref 11.5–15.5)
WBC: 5.5 10*3/uL (ref 4.0–10.5)
nRBC: 0.4 % — ABNORMAL HIGH (ref 0.0–0.2)

## 2023-08-25 MED ORDER — DIPHENOXYLATE-ATROPINE 2.5-0.025 MG PO TABS: 1.0000 | ORAL_TABLET | Freq: Four times a day (QID) | ORAL | Status: DC | PRN

## 2023-08-25 MED ORDER — FOLIC ACID 1 MG PO TABS
1.0000 mg | ORAL_TABLET | Freq: Every day | ORAL | Status: DC
Start: 2023-08-25 — End: 2023-08-26
  Administered 2023-08-25 – 2023-08-26 (×2): 1 mg via ORAL
  Filled 2023-08-25 (×2): qty 1

## 2023-08-25 MED ORDER — PANCRELIPASE (LIP-PROT-AMYL) 12000-38000 UNITS PO CPEP
24000.0000 [IU] | ORAL_CAPSULE | Freq: Three times a day (TID) | ORAL | Status: DC
  Administered 2023-08-25 – 2023-08-26 (×3): 24000 [IU] via ORAL
  Filled 2023-08-25 (×3): qty 2

## 2023-08-25 MED ORDER — VITAMIN B-12 1000 MCG PO TABS
1000.0000 ug | ORAL_TABLET | Freq: Every day | ORAL | Status: DC
  Administered 2023-08-25 – 2023-08-26 (×2): 1000 ug via ORAL
  Filled 2023-08-25 (×2): qty 1

## 2023-08-25 MED ORDER — AMLODIPINE BESYLATE 10 MG PO TABS
10.0000 mg | ORAL_TABLET | Freq: Every day | ORAL | Status: DC
  Administered 2023-08-25 – 2023-08-26 (×2): 10 mg via ORAL
  Filled 2023-08-25 (×2): qty 1

## 2023-08-25 MED ORDER — DIPHENHYDRAMINE HCL 25 MG PO CAPS
25.0000 mg | ORAL_CAPSULE | Freq: Four times a day (QID) | ORAL | Status: DC | PRN
  Administered 2023-08-25 – 2023-08-26 (×2): 25 mg via ORAL
  Filled 2023-08-25 (×2): qty 1

## 2023-08-25 NOTE — Progress Notes (Signed)
PROGRESS NOTE  Cheryl Daniels RUE:[id number] DOB: 09/04/1980 DOA: 08/21/2023 PCP: Murlean Caller, MD  Brief History   The patient is a 43 yr old woman who presented to [facility] with complaints of NVD and worsening right sided back pain. She has been vomiting and unable to keep down any food for 4-5 days. She had a fever of 102.5 on Sunday. She has also had 2-4 watery stool, day.   In the ED the patient was found to be tachycardic, hypokalemic at 2.9, AGMA, negative COVID, Flu, and RSV. Lactic acid was elevated at 3.5 to 2.1 and 3.6. The patient was started on the sepsis protocol and received IV fluids, IV Rocephin and IV Flagyl. She received Zofran and potassium replacement.   This morning the patient states that she is feeling better. Her diet was advanced on 08/23/2023, but today the patient states that she does not feel well. The patient states that she has begun to have diarrhea again.   2/2: Hemodynamically stable, continued to have diarrhea, adding Creon and Imodium switched with Lomotil. Patient was complaining of greasy and floating stool.  A & P  Assessment/Plan Cheryl Daniels is a 42 y.o. female with medical history significant for asthma, HTN, cryptosporidial gastroenteritis, iron deficiency anemia, sepsis, folate deficiency and allergies who presented to the ED for evaluation of N/V/D, and worsening right-sided back pain and admitted for gastroenteritis   Gastroenteritis SIRS Lactic acidosis Resolved SIRS and Lactic Acidosis is resolved. Patient with history of Cryptosporidium gastroenteritis now presenting with 4-5 days of nausea, vomiting and diarrhea.  Patient with tachycardia and an elevated lactic acid likely due to severe dehydration but no leukocytosis, fever and no evidence of urinary, respiratory or intra-abdominal infection. - Enteric panel is negative. -Hold off on further antibiotics for now -Enteric precautions -Adding Creon and Lomotil to see if that will help    Hypokalemia Hypomagnesemia #AGMA Resolved. Supplement Magnesium, Potassium, and phos.   Iron deficiency anemia Folate deficiency Hemoglobin seems to be at baseline, anemia panel with borderline low B12 and folate -Starting on supplement -Trend CBC   Back muscle strain Patient reports ongoing right lower back pain and spasms likely secondary to constant retching.  There is moderate tenderness to palpation of the paraspinal muscles. -As needed IV Dilaudid for pain   HTN BP elevated with SBP in the 130s to 140s in the setting of pain and constant nausea vomiting -CTM   Leiomyomas CT A/P shows several ill-defined lesions favored to represent leiomyomas but normal ovaries and no adnexal mass. Reports her menstrual cycle started today, which is likely contributing to her back pain/spasms. -Follow-up with OB/GYN in the outpatient     DVT prophylaxis: Lovenox      Code Status: Full Code   Consults called: None   Family Communication: No family at bedside    I have seen and examined this patient myself. I have spent 40 minutes in her evaluation and care.    Consultants  None  Procedures  None  Antibiotics   Anti-infectives (From admission, onward)    Start     Dose/Rate Route Frequency Ordered Stop   08/21/23 1230  cefTRIAXone (ROCEPHIN) 2 g in sodium chloride 0.9 % 100 mL IVPB        2 g 200 mL/hr over 30 Minutes Intravenous Once 08/21/23 1225 08/21/23 1454   08/21/23 1230  metroNIDAZOLE (FLAGYL) IVPB 500 mg        50 0 mg 100 mL/hr over 60 Minutes Intravenous  Once  08/21/23 1225 08/21/23 1551        Interval History/Subjective  Patient continued to have some abdominal discomfort and diarrhea.  She does not think that she is ready to get out of the hospital.  She was complaining of greasy and floating stool.  Has history of chronic diarrhea which has been worked up with GI.  Objective     I have personally reviewed the following:   Today's Data    Vitals:   08/24/23 1900 08/25/23 0519  BP: (!) 146/93 (!) 170/98  Pulse: 86 70  Resp: 18 16  Temp: 98.1 F (36.7 C) 98.2 F (36.8 C)  SpO2: 100% 100%   General.  Well-developed lady, in no acute distress. Pulmonary.  Lungs clear bilaterally, normal respiratory effort. CV.  Regular rate and rhythm, no JVD, rub or murmur. Abdomen.  Soft, nontender, nondistended, BS positive. CNS.  Alert and oriented .  No focal neurologic deficit. Extremities.  No edema, no cyanosis, pulses intact and symmetrical. Psychiatry.  Judgment and insight appears normal.   Lab Data  CBC    Component Value Date/Time   WBC 5.5 08/25/2023 0559   RBC 3.83 (L) 08/25/2023 0559   HGB 9.6 (L) 08/25/2023 0559   HGB 10.9 (L) 08/21/2018 1500   HCT 31.5 (L) 08/25/2023 0559   HCT 34.7 08/21/2018 1500   PLT 364 08/25/2023 0559   PLT 486 (H) 08/21/2018 1500   MCV 82.2 08/25/2023 0559   MCV 78 (L) 08/21/2018 1500   MCH 25.1 (L) 08/25/2023 0559   MCHC 30.5 08/25/2023 0559   RDW 19.6 (H) 08/25/2023 0559   RDW 19.9 (H) 08/21/2018 1500   LYMPHSABS 2.1 08/25/2023 0559   LYMPHSABS 2.0 08/21/2018 1500   MONOABS 0.4 08/25/2023 0559   EOSABS 0.2 08/25/2023 0559   EOSABS 0.2 08/21/2018 1500   BASOSABS 0.0 08/25/2023 0559   BASOSABS 0.0 08/21/2018 1500      Latest Ref Rng & Units 08/25/2023    5:59 AM 08/24/2023    6:40 AM 08/23/2023    9:24 AM  BMP  Glucose 70 - 99 mg/dL 90  89  87   BUN 6 - 20 mg/dL 7  7  <5   Creatinine 1.61 - 1.00 mg/dL 0.96  0.45  4.09   Sodium 135 - 145 mmol/L 135  136  138   Potassium 3.5 - 5.1 mmol/L 3.6  3.6  3.8   Chloride 98 - 111 mmol/L 103  103  105   CO2 22 - 32 mmol/L 25  25  22    Calcium 8.9 - 10.3 mg/dL 8.7  8.4  8.4      Micro Data   Results for orders placed or performed during the hospital encounter of 08/21/23  Resp panel by RT-PCR (RSV, Flu A&B, Covid) Anterior Nasal Swab     Status: None   Collection Time: 08/21/23 11:43 AM   Specimen: Anterior Nasal Swab  Result  Value Ref Range Status   SARS Coronavirus 2 by RT PCR NEGATIVE NEGATIVE Final    Comment: (NOTE) SARS-CoV-2 target nucleic acids are NOT DETECTED.  The SARS-CoV-2 RNA is generally detectable in upper respiratory specimens during the acute phase of infection. The lowest concentration of SARS-CoV-2 viral copies this assay can detect is 138 copies/mL. A negative result does not preclude SARS-Cov-2 infection and should not be used as the sole basis for treatment or other patient management decisions. A negative result may occur with  improper specimen collection/handling, submission of specimen  other than nasopharyngeal swab, presence of viral mutation(s) within the areas targeted by this assay, and inadequate number of viral copies(<138 copies/mL). A negative result must be combined with clinical observations, patient history, and epidemiological information. The expected result is Negative.  Fact Sheet for Patients:  BloggerCourse.com  Fact Sheet for Healthcare Providers:  SeriousBroker.it  This test is no t yet approved or cleared by the [place] FDA and  has been authorized for detection and/or diagnosis of SARS-CoV-2 by FDA under an Emergency Use Authorization (EUA). This EUA will remain  in effect (meaning this test can be used) for the duration of the COVID-19 declaration under Section 564(b)(1) of the Act, 21 U.S.C.section 360bbb-3(b)(1), unless the authorization is terminated  or revoked sooner.       Influenza A by PCR NEGATIVE NEGATIVE Final   Influenza B by PCR NEGATIVE NEGATIVE Final    Comment: (NOTE) The Xpert Xpress SARS-CoV-2/FLU/RSV plus assay is intended as an aid in the diagnosis of influenza from Nasopharyngeal swab specimens and should not be used as a sole basis for treatment. Nasal washings and aspirates are unacceptable for Xpert Xpress SARS-CoV-2/FLU/RSV testing.  Fact Sheet for  Patients: BloggerCourse.com  Fact Sheet for Healthcare Providers: SeriousBroker.it  This test is not yet approved or cleared by the [place] FDA and has been authorized for detection and/or diagnosis of SARS-CoV-2 by FDA under an Emergency Use Authorization (EUA). This EUA will remain in effect (meaning this test can be used) for the duration of the COVID-19 declaration under Section 564(b)(1) of the Act, 21 U.S.C. section 360bbb-3(b)(1), unless the authorization is terminated or revoked.     Resp Syncytial Virus by PCR NEGATIVE NEGATIVE Final    Comment: (NOTE) Fact Sheet for Patients: BloggerCourse.com  Fact Sheet for Healthcare Providers: SeriousBroker.it  This test is not yet approved or cleared by the [place] FDA and has been authorized for detection and/or diagnosis of SARS-CoV-2 by FDA under an Emergency Use Authorization (EUA). This EUA will remain in effect (meaning this test can be used) for the duration of the COVID-19 declaration under Section 564(b)(1) of the Act, 21 U.S.C. section 360bbb-3(b)(1), unless the authorization is terminated or revoked.  Performed at [facility], [address], [place], [place] [address]   Blood Culture (routine x 2)     Status: None (Preliminary result)   Collection Time: 08/21/23  1:27 PM   Specimen: BLOOD LEFT ARM  Result Value Ref Range Status   Specimen Description   Final    BLOOD LEFT ARM Performed at [facility], [address] [place], [place] [address]    Special Requests   Final    BOTTLES DRAWN AEROBIC AND ANAEROBIC Blood Culture adequate volume Performed at [facility], [address] [place], [place] [address]    Culture   Final    NO GROWTH 4 DAYS Performed at [facility], [address] [place], [place] [address]    Report Status PENDING   Incomplete  Blood Culture (routine x 2)     Status: None (Preliminary result)   Collection Time: 08/21/23  1:27 PM   Specimen: BLOOD RIGHT ARM  Result Value Ref Range Status   Specimen Description   Final    BLOOD RIGHT ARM Performed at [facility], [address] [place], [place] [address]    Special Requests   Final    BOTTLES DRAWN AEROBIC AND ANAEROBIC Blood Culture adequate volume  Performed at [facility], [address] [place], [place] [address]    Culture   Final    NO GROWTH 4 DAYS Performed at [facility], [address], [place], [place] [address]    Report Status PENDING  Incomplete  Gastrointestinal Panel by PCR , Stool     Status: None   Collection Time: 08/22/23  4:28 AM   Specimen: Stool  Result Value Ref Range Status   Campylobacter species NOT DETECTED NOT DETECTED Final   Plesimonas shigelloides NOT DETECTED NOT DETECTED Final   Salmonella species NOT DETECTED NOT DETECTED Final   Yersinia enterocolitica NOT DETECTED NOT DETECTED Final   Vibrio species NOT DETECTED NOT DETECTED Final   Vibrio cholerae NOT DETECTED NOT DETECTED Final   Enteroaggregative E coli (EAEC) NOT DETECTED NOT DETECTED Final   Enteropathogenic E coli (EPEC) NOT DETECTED NOT DETECTED Final   Enterotoxigenic E coli (ETEC) NOT DETECTED NOT DETECTED Final   Shiga like toxin producing E coli (STEC) NOT DETECTED NOT DETECTED Final   Shigella/Enteroinvasive E coli (EIEC) NOT DETECTED NOT DETECTED Final   Cryptosporidium NOT DETECTED NOT DETECTED Final   Cyclospora cayetanensis NOT DETECTED NOT DETECTED Final   Entamoeba histolytica NOT DETECTED NOT DETECTED Final   Giardia lamblia NOT DETECTED NOT DETECTED Final   Adenovirus F40/41 NOT DETECTED NOT DETECTED Final   Astrovirus NOT DETECTED NOT DETECTED Final   Norovirus GI/GII NOT DETECTED NOT DETECTED Final   Rotavirus A NOT DETECTED NOT DETECTED Final   Sapovirus (I, II, IV, and V) NOT DETECTED NOT  DETECTED Final    Comment: Performed at [facility], [address] [place], [place] [address]  Respiratory (~20 pathogens) panel by PCR     Status: None   Collection Time: 08/24/23 12:29 PM   Specimen: Nasopharyngeal Swab; Respiratory  Result Value Ref Range Status   Adenovirus NOT DETECTED NOT DETECTED Final   Coronavirus 229E NOT DETECTED NOT DETECTED Final    Comment: (NOTE) The Coronavirus on the Respiratory Panel, DOES NOT test for the novel  Coronavirus (2019 nCoV)    Coronavirus HKU1 NOT DETECTED NOT DETECTED Final   Coronavirus NL63 NOT DETECTED NOT DETECTED Final   Coronavirus OC43 NOT DETECTED NOT DETECTED Final   Metapneumovirus NOT DETECTED NOT DETECTED Final   Rhinovirus / Enterovirus NOT DETECTED NOT DETECTED Final   Influenza A NOT DETECTED NOT DETECTED Final   Influenza B NOT DETECTED NOT DETECTED Final   Parainfluenza Virus 1 NOT DETECTED NOT DETECTED Final   Parainfluenza Virus 2 NOT DETECTED NOT DETECTED Final   Parainfluenza Virus 3 NOT DETECTED NOT DETECTED Final   Parainfluenza Virus 4 NOT DETECTED NOT DETECTED Final   Respiratory Syncytial Virus NOT DETECTED NOT DETECTED Final   Bordetella pertussis NOT DETECTED NOT DETECTED Final   Bordetella Parapertussis NOT DETECTED NOT DETECTED Final   Chlamydophila pneumoniae NOT DETECTED NOT DETECTED Final   Mycoplasma pneumoniae NOT DETECTED NOT DETECTED Final    Comment: Performed at [facility], [address], [place], [place] [address]  SARS Coronavirus 2 by RT PCR (hospital order, performed in [facility] hospital lab) *cepheid single result test* Anterior Nasal Swab     Status: None   Collection Time: 08/24/23 12:29 PM   Specimen: Anterior Nasal Swab  Result Value Ref Range Status   SARS Coronavirus 2 by RT PCR NEGATIVE NEGATIVE Final    Comment: (NOTE) SARS-CoV-2 target nucleic acids are NOT DETECTED.  The  SARS-CoV-2 RNA is generally detectable in upper and lower respiratory specimens  during the acute phase of infection. The lowest concentration of SARS-CoV-2 viral copies this assay can detect is 250 copies / mL. A negative result does not preclude SARS-CoV-2 infection and should not be used as the sole basis for treatment or other patient management decisions.  A negative result may occur with improper specimen collection / handling, submission of specimen other than nasopharyngeal swab, presence of viral mutation(s) within the areas targeted by this assay, and inadequate number of viral copies (<250 copies / mL). A negative result must be combined with clinical observations, patient history, and epidemiological information.  Fact Sheet for Patients:   RoadLapTop.co.za  Fact Sheet for Healthcare Providers: http://kim-miller.com/  This test is not yet approved or  cleared by the [place] FDA and has been authorized for detection and/or diagnosis of SARS-CoV-2 by FDA under an Emergency Use Authorization (EUA).  This EUA will remain in effect (meaning this test can be used) for the duration of the COVID-19 declaration under Section 564(b)(1) of the Act, 21 U.S.C. section 360bbb-3(b)(1), unless the authorization is terminated or revoked sooner.  Performed at [facility], [address] [place], [place] [address]     Scheduled Meds:  amLODipine  10 mg Oral Daily   enoxaparin (LOVENOX) injection  40 mg Subcutaneous Q24H   lidocaine  1 patch Transdermal Q24H   lipase/protease/amylase  24,000 Units Oral TID WC   loperamide  4 mg Oral Daily   Continuous Infusions:  Arnetha Courser, MD [facility] Direct contact: see [email or web address]  7PM-7AM contact night coverage as above 08/25/2023, 11:50 AM  LOS: 3 days

## 2023-08-25 NOTE — Plan of Care (Signed)

## 2023-08-25 NOTE — Plan of Care (Signed)

## 2023-08-25 NOTE — Plan of Care (Signed)

## 2023-08-26 DIAGNOSIS — K529 Noninfective gastroenteritis and colitis, unspecified: Secondary | ICD-10-CM | POA: Diagnosis not present

## 2023-08-26 DIAGNOSIS — E872 Acidosis, unspecified: Secondary | ICD-10-CM | POA: Diagnosis not present

## 2023-08-26 DIAGNOSIS — A084 Viral intestinal infection, unspecified: Secondary | ICD-10-CM | POA: Diagnosis not present

## 2023-08-26 LAB — CULTURE, BLOOD (ROUTINE X 2)
Culture: NO GROWTH
Culture: NO GROWTH
Special Requests: ADEQUATE
Special Requests: ADEQUATE

## 2023-08-26 MED ORDER — AMLODIPINE BESYLATE 10 MG PO TABS: 10.0000 mg | ORAL_TABLET | Freq: Every day | ORAL | 2 refills | Status: AC

## 2023-08-26 MED ORDER — LOSARTAN POTASSIUM 50 MG PO TABS
25.0000 mg | ORAL_TABLET | Freq: Every day | ORAL | Status: DC
  Administered 2023-08-26: 25 mg via ORAL
  Filled 2023-08-26: qty 1

## 2023-08-26 MED ORDER — LOSARTAN POTASSIUM 25 MG PO TABS: 25.0000 mg | ORAL_TABLET | Freq: Every day | ORAL | 1 refills | Status: AC

## 2023-08-26 MED ORDER — TRAMADOL HCL 50 MG PO TABS: 50.0000 mg | ORAL_TABLET | Freq: Four times a day (QID) | ORAL | 0 refills | Status: AC | PRN

## 2023-08-26 MED ORDER — PANCRELIPASE (LIP-PROT-AMYL) 24000-76000 UNITS PO CPEP: 24000.0000 [IU] | ORAL_CAPSULE | Freq: Three times a day (TID) | ORAL | 0 refills | Status: AC

## 2023-08-26 MED ORDER — FOLIC ACID 1 MG PO TABS: 1.0000 mg | ORAL_TABLET | Freq: Every day | ORAL | 1 refills | Status: AC

## 2023-08-26 MED ORDER — CYANOCOBALAMIN 1000 MCG PO TABS: 1000.0000 ug | ORAL_TABLET | Freq: Every day | ORAL | 1 refills | Status: AC

## 2023-08-26 MED ORDER — TRAMADOL HCL 50 MG PO TABS
50.0000 mg | ORAL_TABLET | Freq: Once | ORAL | Status: AC
  Administered 2023-08-26: 50 mg via ORAL
  Filled 2023-08-26: qty 1

## 2023-08-26 NOTE — Discharge Summary (Signed)
Physician Discharge Summary   Patient: Cheryl Daniels MRN: [id number] DOB: [birth year]  Admit date:     08/21/2023  Discharge date: 08/26/23  Discharge Physician: Arnetha Courser   PCP: Murlean Caller, MD   Recommendations at discharge:  Please obtain CBC and BMP on follow-up Patient is being started on losartan along with amlodipine for better control of hypertension, please reevaluate and titrate accordingly. We also started her on Creon and it helped with her diarrhea, need a follow-up with gastroenterology to rule out pancreatic insufficiency. Follow-up with primary care provider Follow-up with gastroenterology  Discharge Diagnoses: Principal Problem:   Gastroenteritis Active Problems:   Lactic acid acidosis   Gastroenteritis and colitis, viral   Hospital Course: The patient is a 43 yr old woman who presented to [facility] with complaints of NVD and worsening right sided back pain. She has been vomiting and unable to keep down any food for 4-5 days. She had a fever of 102.5 on Sunday. She has also had 2-4 watery stool, day.    In the ED the patient was found to be tachycardic, hypokalemic at 2.9, AGMA, negative COVID, Flu, and RSV. Lactic acid was elevated at 3.5 to 2.1 and 3.6. The patient was started on the sepsis protocol and received IV fluids, IV Rocephin and IV Flagyl. She received Zofran and potassium replacement.  Patient likely has SIRS response with gastroenteritis, GI pathogen panel remain negative.  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  Sepsis ruled out and antibiotics were not continued.  Patient has waning and waxing diarrhea, she was complaining of floating creasing stools so she started on Creon which helped and diarrhea resolved.  Patient was given prescription of Creon and need to discuss with her gastroenterologist to rule out any further pancreatic insufficiency.  Patient continued to have some back pain, she was given some tramadol  to use only if Tylenol does not work.  Patient has baseline anemia, anemia panel with borderline low B12 and folate so she was started on supplement.  Her PCP can monitor and titrate.  CT abdomen and pelvis shows several ill-defined lesions favored to represent leiomyomas but normal ovaries and no adnexal mass.  She need to follow-up with her gynecologist as outpatient for further assistance  Patient was also started on low-dose losartan due to persistently elevated blood pressure despite getting amlodipine.  She need a close follow-up with PCP and they can titrate as needed.  Patient will continue on current medications and need to have a close follow-up with her providers for further management.  Pain control -  Controlled Substance Reporting System database was reviewed. and patient was instructed, not to drive, operate heavy machinery, perform activities at heights, swimming or participation in water activities or provide baby-sitting services while on Pain, Sleep and Anxiety Medications; until their outpatient Physician has advised to do so again. Also recommended to not to take more than prescribed Pain, Sleep and Anxiety Medications.  Consultants: None Procedures performed: None Disposition: Home Diet recommendation:  Discharge Diet Orders (From admission, onward)     Start     Ordered   08/26/23 0000  Diet - low sodium heart healthy        02 /03/25 0844           Cardiac diet DISCHARGE MEDICATION: Allergies as of 08/26/2023   No Known Allergies      Medication List     TAKE these medications    amLODipine 10 MG tablet  Commonly known as: NORVASC Take 1 tablet (10 mg total) by mouth daily.   cyanocobalamin 1000 MCG tablet Take 1 tablet (1,000 mcg total) by mouth daily.   folic acid 1 MG tablet Commonly known as: FOLVITE Take 1 tablet (1 mg total) by mouth daily.   losartan 25 MG tablet Commonly known as: COZAAR Take 1 tablet (25 mg total) by mouth  daily.   Pancrelipase (Lip-Prot-Amyl) 24000-76000 units Cpep Take 1 capsule (24,000 Units total) by mouth 3 (three) times daily with meals.   traMADol 50 MG tablet Commonly known as: Ultram Take 1 tablet (50 mg total) by mouth every 6 (six) hours as needed for moderate pain (pain score 4-6) or severe pain (pain score 7-10).        Follow-up Information     Murlean Caller, MD. Schedule an appointment as soon as possible for a visit in 1 week(s).   Specialty: Family Medicine Contact information: [address] [place] [address] [phone]                Discharge Exam: Ceasar Mons Weights   08/21/23 1126  Weight: 68 kg   General.  Well-developed lady, in no acute distress. Pulmonary.  Lungs clear bilaterally, normal respiratory effort. CV.  Regular rate and rhythm, no JVD, rub or murmur. Abdomen.  Soft, nontender, nondistended, BS positive. CNS.  Alert and oriented .  No focal neurologic deficit. Extremities.  No edema, no cyanosis, pulses intact and symmetrical. Psychiatry.  Judgment and insight appears normal.   Condition at discharge: stable  The results of significant diagnostics from this hospitalization (including imaging, microbiology, ancillary and laboratory) are listed below for reference.   Imaging Studies: CT ABDOMEN PELVIS W CONTRAST Result Date: 08/21/2023 CLINICAL DATA:  Right lower quadrant abdominal pain. EXAM: CT ABDOMEN AND PELVIS WITH CONTRAST TECHNIQUE: Multidetector CT imaging of the abdomen and pelvis was performed using the standard protocol following bolus administration of intravenous contrast. RADIATION DOSE REDUCTION: This exam was performed according to the departmental dose-optimization program which includes automated exposure control, adjustment of the mA and/or kV according to patient size and/or use of iterative reconstruction technique. CONTRAST:  OMNIPAQUE IOHEXOL 300 MG/ML  SOLN COMPARISON:  CT scan abdomen and pelvis from  06/16/2023. FINDINGS: Lower chest: The lung bases are clear. No pleural effusion. The heart is normal in size. No pericardial effusion. Hepatobiliary: The liver is normal in size. Non-cirrhotic configuration. No suspicious mass. These is mild diffuse hepatic steatosis. No intrahepatic or extrahepatic bile duct dilation. No calcified gallstones. Normal gallbladder wall thickness. No pericholecystic inflammatory changes. Pancreas: Unremarkable. No pancreatic ductal dilatation or surrounding inflammatory changes. Spleen: Within normal limits. No focal lesion. Adrenals/Urinary Tract: Adrenal glands are unremarkable. No suspicious renal mass. No hydronephrosis. No renal or ureteric calculi. Urinary bladder is under distended, precluding optimal assessment. However, no large mass or stones identified. No perivesical fat stranding. Stomach/Bowel: There is a small sliding hiatal hernia. No disproportionate dilation of the small or large bowel loops. No evidence of abnormal bowel wall thickening or inflammatory changes. The appendix is unremarkable. There is submucosal fat deposition also known as "Fat halo sign" noted in the cecum and ascending colon. In the absence of clinical evidence of inflammatory bowel disease, the presence of the "fat halo sign" represent a normal finding that is possibly related to obesity. Vascular/Lymphatic: No ascites or pneumoperitoneum. No abdominal or pelvic lymphadenopathy, by size criteria. No aneurysmal dilation of the major abdominal arteries. Reproductive: Bulky anteverted uterus containing several ill-defined  lesions, favored to represent leiomyomas. Bilateral ovaries are within normal limits. No adnexal mass seen. Other: There is a tiny fat containing umbilical hernia. The soft tissues and abdominal wall are otherwise unremarkable. Musculoskeletal: No suspicious osseous lesions. IMPRESSION: *No acute inflammatory process identified within the abdomen or pelvis. Unremarkable appendix.  *Multiple other nonacute observations, as described above. Electronically Signed   By: Jules Schick M.D.   On: 08/21/2023 15:25   DG Chest Portable 1 View Result Date: 08/21/2023 CLINICAL DATA:  Fever. EXAM: PORTABLE CHEST 1 VIEW COMPARISON:  Chest radiograph dated June 16, 2023. FINDINGS: The heart size and mediastinal contours are within normal limits. No focal consolidation, sizeable pleural effusion, or pneumothorax. No acute osseous abnormality. IMPRESSION: No acute cardiopulmonary findings. Electronically Signed   By: Hart Robinsons M.D.   On: 08/21/2023 13:45    Microbiology: Results for orders placed or performed during the hospital encounter of 08/21/23  Resp panel by RT-PCR (RSV, Flu A&B, Covid) Anterior Nasal Swab     Status: None   Collection Time: 08/21/23 11:43 AM   Specimen: Anterior Nasal Swab  Result Value Ref Range Status   SARS Coronavirus 2 by RT PCR NEGATIVE NEGATIVE Final    Comment: (NOTE) SARS-CoV-2 target nucleic acids are NOT DETECTED.  The SARS-CoV-2 RNA is generally detectable in upper respiratory specimens during the acute phase of infection. The lowest concentration of SARS-CoV-2 viral copies this assay can detect is 138 copies/mL. A negative result does not preclude SARS-Cov-2 infection and should not be used as the sole basis for treatment or other patient management decisions. A negative result may occur with  improper specimen collection/handling, submission of specimen other than nasopharyngeal swab, presence of viral mutation(s) within the areas targeted by this assay, and inadequate number of viral copies(<138 copies/mL). A negative result must be combined with clinical observations, patient history, and epidemiological information. The expected result is Negative.  Fact Sheet for Patients:  BloggerCourse.com  Fact Sheet for Healthcare Providers:  SeriousBroker.it  This test is no t yet  approved or cleared by the [place] FDA and  has been authorized for detection and/or diagnosis of SARS-CoV-2 by FDA under an Emergency Use Authorization (EUA). This EUA will remain  in effect (meaning this test can be used) for the duration of the COVID-19 declaration under Section 564(b)(1) of the Act, 21 U.S.C.section 360bbb-3(b)(1), unless the authorization is terminated  or revoked sooner.       Influenza A by PCR NEGATIVE NEGATIVE Final   Influenza B by PCR NEGATIVE NEGATIVE Final    Comment: (NOTE) The Xpert Xpress SARS-CoV-2/FLU/RSV plus assay is intended as an aid in the diagnosis of influenza from Nasopharyngeal swab specimens and should not be used as a sole basis for treatment. Nasal washings and aspirates are unacceptable for Xpert Xpress SARS-CoV-2/FLU/RSV testing.  Fact Sheet for Patients: BloggerCourse.com  Fact Sheet for Healthcare Providers: SeriousBroker.it  This test is not yet approved or cleared by the [place] FDA and has been authorized for detection and/or diagnosis of SARS-CoV-2 by FDA under an Emergency Use Authorization (EUA). This EUA will remain in effect (meaning this test can be used) for the duration of the COVID-19 declaration under Section 564(b)(1) of the Act, 21 U.S.C. section 360bbb-3(b)(1), unless the authorization is terminated or revoked.     Resp Syncytial Virus by PCR NEGATIVE NEGATIVE Final    Comment: (NOTE) Fact Sheet for Patients: BloggerCourse.com  Fact Sheet for Healthcare Providers: SeriousBroker.it  This test is not yet  approved or cleared by the [place] and has been authorized for detection and/or diagnosis of SARS-CoV-2 by FDA under an Emergency Use Authorization (EUA). This EUA will remain in effect (meaning this test can be used) for the duration of the COVID-19 declaration under Section 564(b)(1) of  the Act, 21 U.S.C. section 360bbb-3(b)(1), unless the authorization is terminated or revoked.  Performed at [facility], [address] [place], [place] [address]   Blood Culture (routine x 2)     Status: None   Collection Time: 08/21/23  1:27 PM   Specimen: BLOOD LEFT ARM  Result Value Ref Range Status   Specimen Description   Final    BLOOD LEFT ARM Performed at [facility], [address], [place], [place] [address]    Special Requests   Final    BOTTLES DRAWN AEROBIC AND ANAEROBIC Blood Culture adequate volume Performed at [facility], [address] [place], [place] [address]    Culture   Final    NO GROWTH 5 DAYS Performed at [facility], [address], [place], [place] [address]    Report Status 08/26/2023 FINAL  Final  Blood Culture (routine x 2)     Status: None   Collection Time: 08/21/23  1:27 PM   Specimen: BLOOD RIGHT ARM  Result Value Ref Range Status   Specimen Description   Final    BLOOD RIGHT ARM Performed at [facility], [address], [place], [place] [address]    Special Requests   Final    BOTTLES DRAWN AEROBIC AND ANAEROBIC Blood Culture adequate volume Performed at [facility], [address] [place], [place] [address]    Culture   Final    NO GROWTH 5 DAYS Performed at [facility], [address], [place], [place] [address]    Report Status 08/26/2023 FINAL  Final  Gastrointestinal Panel by PCR , Stool     Status: None   Collection Time: 08/22/23  4:28 AM   Specimen: Stool  Result Value Ref Range Status   Campylobacter species NOT DETECTED NOT DETECTED Final   Plesimonas shigelloides NOT DETECTED NOT DETECTED Final   Salmonella species NOT DETECTED NOT DETECTED Final   Yersinia enterocolitica NOT DETECTED NOT DETECTED Final   Vibrio species NOT DETECTED NOT DETECTED Final   Vibrio cholerae NOT DETECTED NOT DETECTED Final   Enteroaggregative E coli  (EAEC) NOT DETECTED NOT DETECTED Final   Enteropathogenic E coli (EPEC) NOT DETECTED NOT DETECTED Final   Enterotoxigenic E coli (ETEC) NOT DETECTED NOT DETECTED Final   Shiga like toxin producing E coli (STEC) NOT DETECTED NOT DETECTED Final   Shigella/Enteroinvasive E coli (EIEC) NOT DETECTED NOT DETECTED Final   Cryptosporidium NOT DETECTED NOT DETECTED Final   Cyclospora cayetanensis NOT DETECTED NOT DETECTED Final   Entamoeba histolytica NOT DETECTED NOT DETECTED Final   Giardia lamblia NOT DETECTED NOT DETECTED Final   Adenovirus F40/41 NOT DETECTED NOT DETECTED Final   Astrovirus NOT DETECTED NOT DETECTED Final   Norovirus GI/GII NOT DETECTED NOT DETECTED Final   Rotavirus A NOT DETECTED NOT DETECTED Final   Sapovirus (I, II, IV, and V) NOT DETECTED NOT DETECTED Final    Comment: Performed at [facility], [address] [place], [place] [address]  Respiratory (~20 pathogens) panel by PCR     Status: None   Collection Time: 08/24/23 12:29 PM   Specimen: Nasopharyngeal Swab;  Respiratory  Result Value Ref Range Status   Adenovirus NOT DETECTED NOT DETECTED Final   Coronavirus 229E NOT DETECTED NOT DETECTED Final    Comment: (NOTE) The Coronavirus on the Respiratory Panel, DOES NOT test for the novel  Coronavirus (2019 nCoV)    Coronavirus HKU1 NOT DETECTED NOT DETECTED Final   Coronavirus NL63 NOT DETECTED NOT DETECTED Final   Coronavirus OC43 NOT DETECTED NOT DETECTED Final   Metapneumovirus NOT DETECTED NOT DETECTED Final   Rhinovirus / Enterovirus NOT DETECTED NOT DETECTED Final   Influenza A NOT DETECTED NOT DETECTED Final   Influenza B NOT DETECTED NOT DETECTED Final   Parainfluenza Virus 1 NOT DETECTED NOT DETECTED Final   Parainfluenza Virus 2 NOT DETECTED NOT DETECTED Final   Parainfluenza Virus 3 NOT DETECTED NOT DETECTED Final   Parainfluenza Virus 4 NOT DETECTED NOT DETECTED Final   Respiratory Syncytial Virus NOT DETECTED NOT DETECTED Final    Bordetella pertussis NOT DETECTED NOT DETECTED Final   Bordetella Parapertussis NOT DETECTED NOT DETECTED Final   Chlamydophila pneumoniae NOT DETECTED NOT DETECTED Final   Mycoplasma pneumoniae NOT DETECTED NOT DETECTED Final    Comment: Performed at [facility], [address] [place], [place] [address]  SARS Coronavirus 2 by RT PCR (hospital order, performed in [facility] hospital lab) *cepheid single result test* Anterior Nasal Swab     Status: None   Collection Time: 08/24/23 12:29 PM   Specimen: Anterior Nasal Swab  Result Value Ref Range Status   SARS Coronavirus 2 by RT PCR NEGATIVE NEGATIVE Final    Comment: (NOTE) SARS-CoV-2 target nucleic acids are NOT DETECTED.  The SARS-CoV-2 RNA is generally detectable in upper and lower respiratory specimens during the acute phase of infection. The lowest concentration of SARS-CoV-2 viral copies this assay can detect is 250 copies / mL. A negative result does not preclude SARS-CoV-2 infection and should not be used as the sole basis for treatment or other patient management decisions.  A negative result may occur with improper specimen collection / handling, submission of specimen other than nasopharyngeal swab, presence of viral mutation(s) within the areas targeted by this assay, and inadequate number of viral copies (<250 copies / mL). A negative result must be combined with clinical observations, patient history, and epidemiological information.  Fact Sheet for Patients:   RoadLapTop.co.za  Fact Sheet for Healthcare Providers: http://kim-miller.com/  This test is not yet approved or  cleared by the [place] FDA and has been authorized for detection and/or diagnosis of SARS-CoV-2 by FDA under an Emergency Use Authorization (EUA).  This EUA will remain in effect (meaning this test can be used) for the duration of the COVID-19 declaration under Section 564(b)(1) of the Act,  21 U.S.C. section 360bbb-3(b)(1), unless the authorization is terminated or revoked sooner.  Performed at [facility], [address] [place], [place] [address]     Labs: CBC: Recent Labs  Lab 08/21/23 1143 08/22/23 0538 08/23/23 0924 08/24/23 0640 08/25/23 0559  WBC 6.8 4.4 4.1 4.7 5.5  NEUTROABS 5.4  --  2.5 3.0 2.8  HGB 11.4* 8.8* 9.1* 9.3* 9.6*  HCT 37.2 29.2* 30.3* 30.7* 31.5*  MCV 80.7 83.4 83.0 82.5 82.2  PLT 509* 350 354 369 364   Basic Metabolic Panel: Recent Labs  Lab 08/21/23 1143 08/21/23 2315 08/22/23 0538 08/23/23 0924 08/24/23 0640 08/25/23 0559  NA 139  --  134* 138 136 135  K 2.9*  --  3.1* 3.8  3.6 3.6  CL 101  --  102 105 103 103  CO2 21*  --  24 22 25 25   GLUCOSE 112*  --  88 87 89 90  BUN 11  --  10 <5* 7 7  CREATININE 0.64  --  0.66 0.47 0.66 0.76  CALCIUM 8.8*  --  7.8* 8.4* 8.4* 8.7*  MG  --  1.6*  --   --   --   --   PHOS  --  1.4*  --   --   --   --    Liver Function Tests: Recent Labs  Lab 08/21/23 1143  AST 31  ALT 18  ALKPHOS 99  BILITOT 0.6  PROT 7.6  ALBUMIN 4.3   CBG: No results for input(s): "GLUCAP" in the last 168 hours.  Discharge time spent: greater than 30 minutes.  This record has been created using Conservation officer, historic buildings. Errors have been sought and corrected,but may not always be located. Such creation errors do not reflect on the standard of care.   Signed: Arnetha Courser, MD [facility] 08/26/2023

## 2023-08-26 NOTE — Plan of Care (Signed)

## 2023-09-22 IMAGING — CT CT HEAD W/O CM
3 series · 16 of 47 positions shown, 19 images · non-contrast
Comparison: None.

CLINICAL DATA: Chronic headache.

EXAM:
CT HEAD WITHOUT CONTRAST
TECHNIQUE: Contiguous axial images were obtained from the base of the skull
through the vertex without intravenous contrast.

[Series 2: head wo · axial · 0.40mm/px · z∈[-84,+41]mm · 10 of 31 slices shown, 13 images]
[im 3/31  brain]
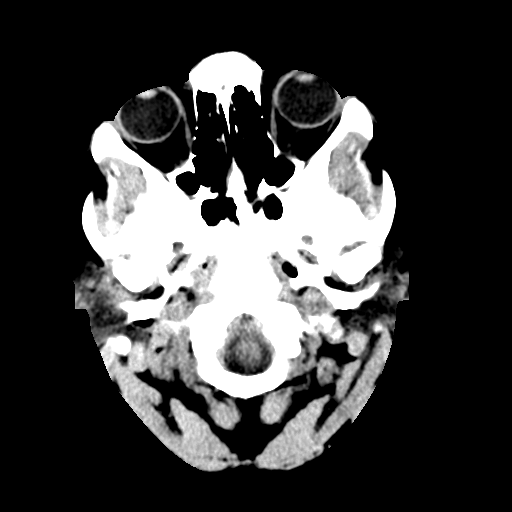
[im 3/31  bone]
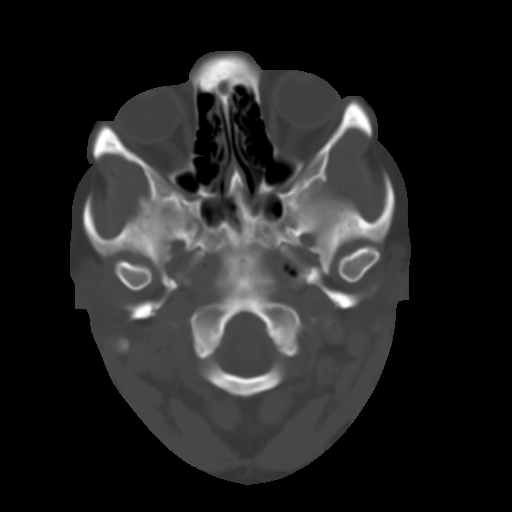
[im 6/31  brain]
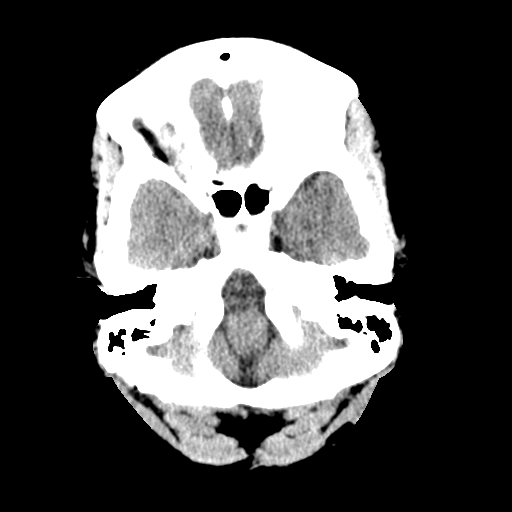
[im 9/31  brain]
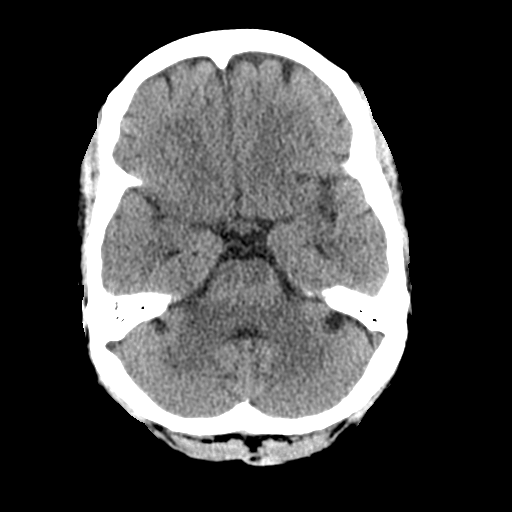
[im 11/31  brain]
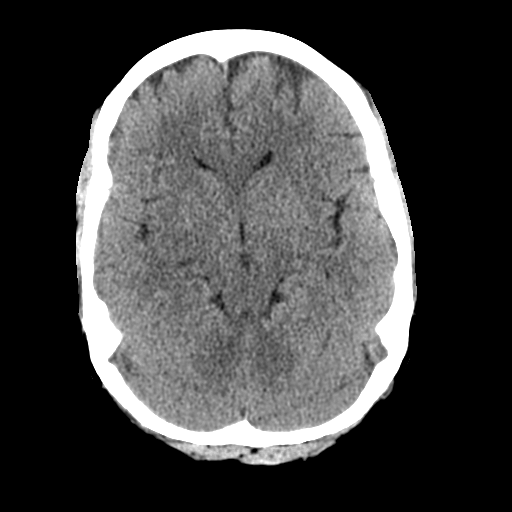
[im 14/31  brain]
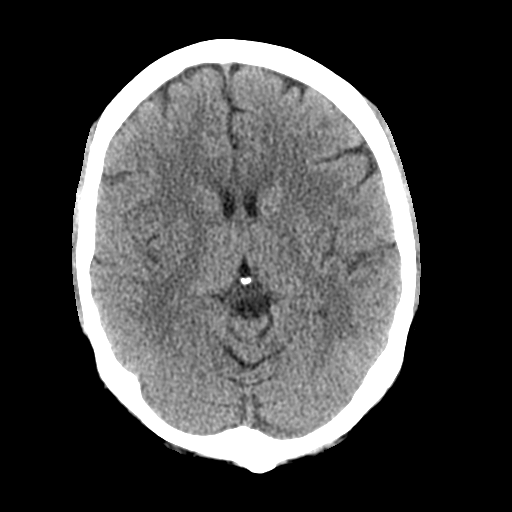
[im 14/31  bone]
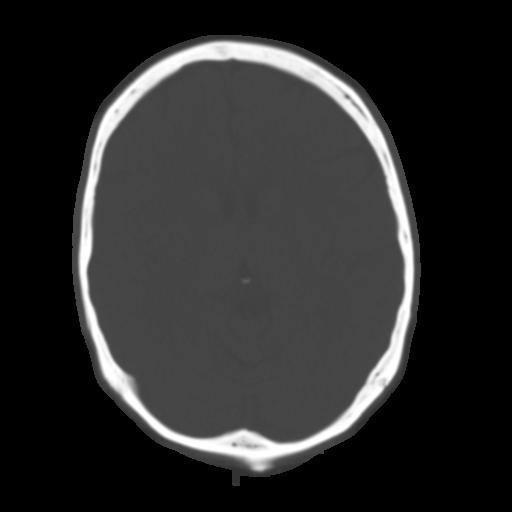
[im 17/31  brain]
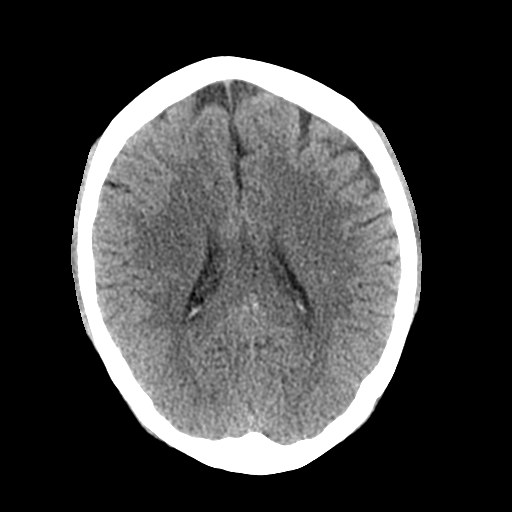
[im 20/31  brain]
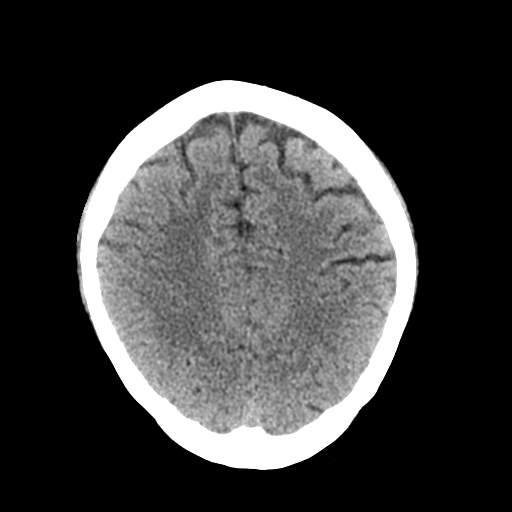
[im 23/31  brain]
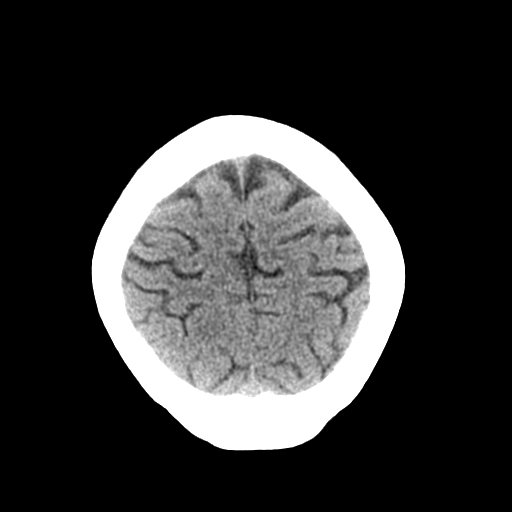
[im 25/31  brain]
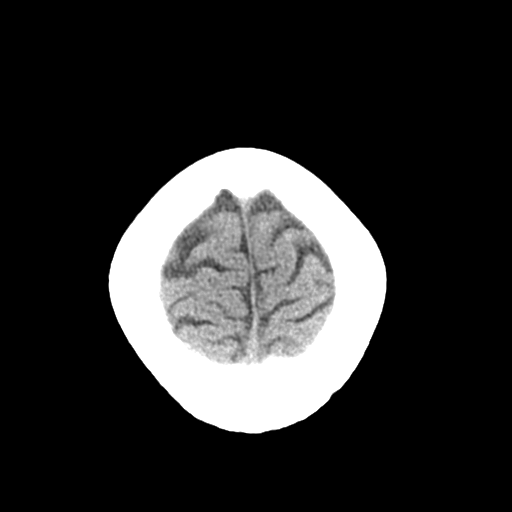
[im 25/31  bone]
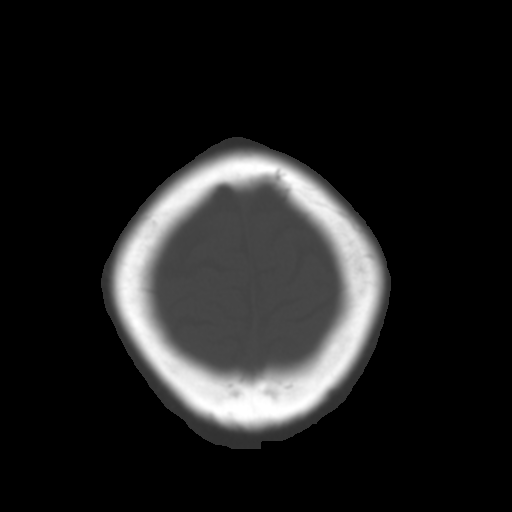
[im 28/31  brain]
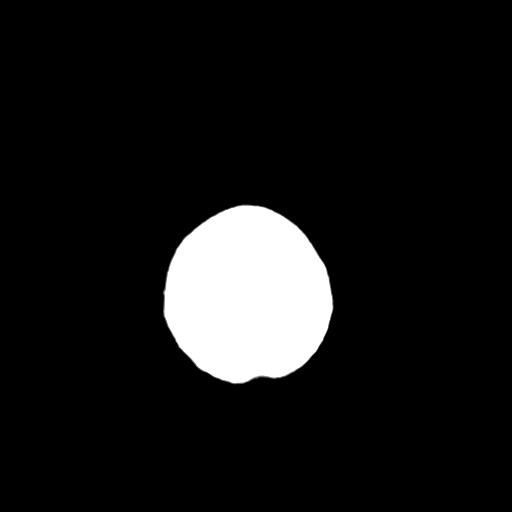

[Series 5: coronal soft tissue · coronal · 0.29mm/px · 3 of 63 slices shown]
[im 21/63  brain]
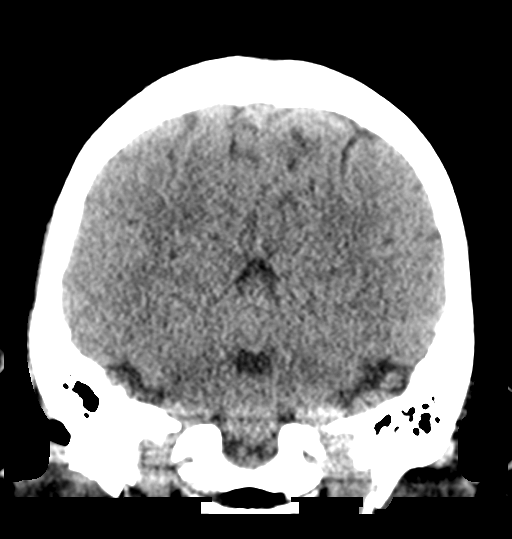
[im 28/63  brain]
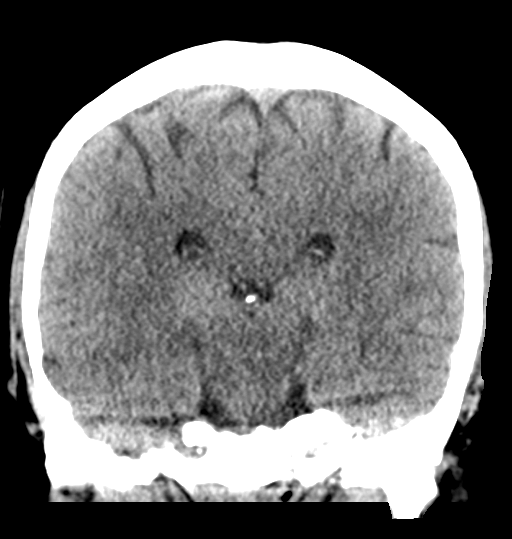
[im 35/63  brain]
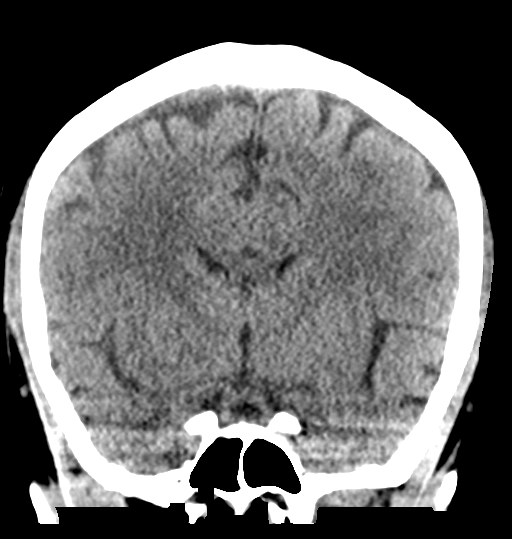

[Series 6: sagittal soft tissue · sagittal · 0.31mm/px · 3 of 51 slices shown]
[im 17/51  brain]
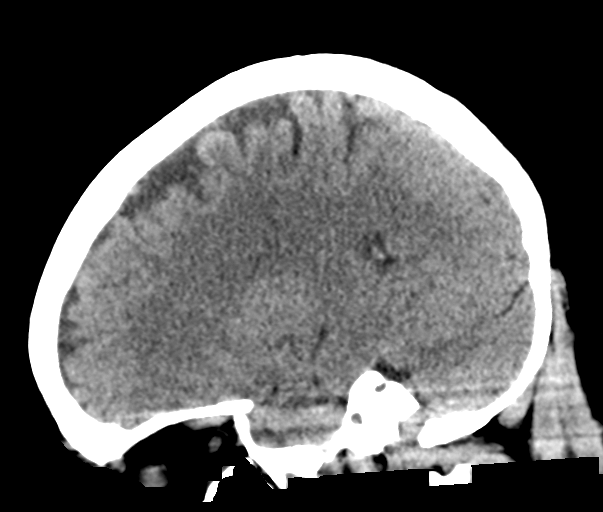
[im 26/51  brain]
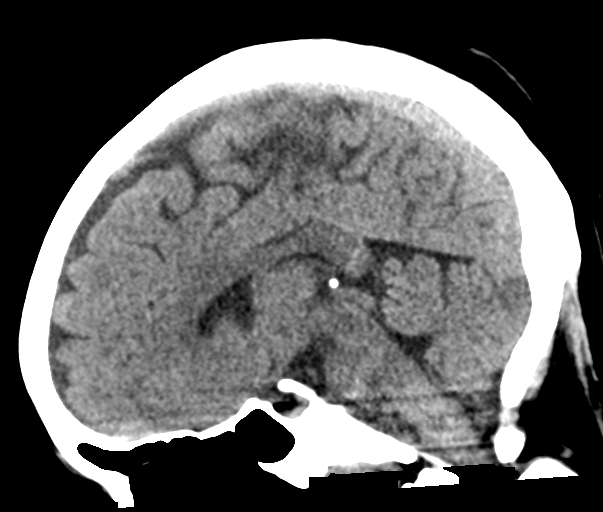
[im 34/51  brain]
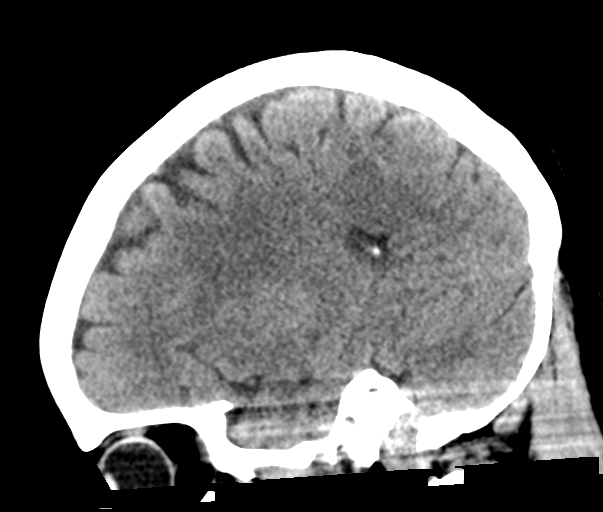

[16 of 47 positions shown; findings below may reference images not displayed]

FINDINGS: Brain: No evidence of acute infarction, hemorrhage, hydrocephalus,
extra-axial collection or mass lesion/mass effect.

Vascular: No hyperdense vessel or unexpected calcification.

Skull: Normal. Negative for fracture or focal lesion.

Sinuses/Orbits: Globes and orbits are unremarkable. Visualized
sinuses are clear.

Other: None.
IMPRESSION: Normal unenhanced CT scan of the brain.

## 2024-01-29 ENCOUNTER — Other Ambulatory Visit: Payer: Self-pay
